# Patient Record
Sex: Female | Born: 1940 | Race: White | Hispanic: No | Smoking: Former smoker
Health system: Southern US, Community
[De-identification: ages and names within clinical notes are randomized; demographics above are authoritative.]

## PROBLEM LIST (undated history)

## (undated) DIAGNOSIS — E782 Mixed hyperlipidemia: Secondary | ICD-10-CM

## (undated) DIAGNOSIS — K219 Gastro-esophageal reflux disease without esophagitis: Secondary | ICD-10-CM

## (undated) DIAGNOSIS — I2510 Atherosclerotic heart disease of native coronary artery without angina pectoris: Secondary | ICD-10-CM

## (undated) DIAGNOSIS — F411 Generalized anxiety disorder: Secondary | ICD-10-CM

## (undated) DIAGNOSIS — J449 Chronic obstructive pulmonary disease, unspecified: Secondary | ICD-10-CM

## (undated) DIAGNOSIS — I10 Essential (primary) hypertension: Secondary | ICD-10-CM

## (undated) DIAGNOSIS — I6350 Cerebral infarction due to unspecified occlusion or stenosis of unspecified cerebral artery: Secondary | ICD-10-CM

## (undated) DIAGNOSIS — I6529 Occlusion and stenosis of unspecified carotid artery: Secondary | ICD-10-CM

## (undated) HISTORY — DX: Chronic obstructive pulmonary disease, unspecified: J44.9

## (undated) HISTORY — DX: Gastro-esophageal reflux disease without esophagitis: K21.9

## (undated) HISTORY — DX: Essential (primary) hypertension: I10

## (undated) HISTORY — DX: Cerebral infarction due to unspecified occlusion or stenosis of unspecified cerebral artery: I63.50

## (undated) HISTORY — DX: Atherosclerotic heart disease of native coronary artery without angina pectoris: I25.10

## (undated) HISTORY — DX: Mixed hyperlipidemia: E78.2

## (undated) HISTORY — PX: TOTAL KNEE ARTHROPLASTY: SHX125

## (undated) HISTORY — DX: Occlusion and stenosis of unspecified carotid artery: I65.29

## (undated) HISTORY — PX: BACK SURGERY: SHX140

## (undated) HISTORY — PX: CHOLECYSTECTOMY: SHX55

## (undated) HISTORY — DX: Generalized anxiety disorder: F41.1

## (undated) HISTORY — PX: ABDOMINAL HYSTERECTOMY: SHX81

---

## 1995-09-07 HISTORY — PX: CORONARY ARTERY BYPASS GRAFT: SHX141

## 1999-07-01 ENCOUNTER — Encounter: Payer: Self-pay | Admitting: Emergency Medicine

## 1999-07-01 ENCOUNTER — Inpatient Hospital Stay (HOSPITAL_COMMUNITY): Admission: EM | Admit: 1999-07-01 | Discharge: 1999-07-02 | Payer: Self-pay | Admitting: Emergency Medicine

## 1999-07-02 ENCOUNTER — Encounter: Payer: Self-pay | Admitting: *Deleted

## 2000-05-02 ENCOUNTER — Encounter: Payer: Self-pay | Admitting: Emergency Medicine

## 2000-05-02 ENCOUNTER — Inpatient Hospital Stay (HOSPITAL_COMMUNITY): Admission: EM | Admit: 2000-05-02 | Discharge: 2000-05-04 | Payer: Self-pay | Admitting: Emergency Medicine

## 2001-12-26 ENCOUNTER — Inpatient Hospital Stay (HOSPITAL_COMMUNITY): Admission: AD | Admit: 2001-12-26 | Discharge: 2001-12-28 | Payer: Self-pay | Admitting: Cardiology

## 2001-12-28 ENCOUNTER — Encounter: Payer: Self-pay | Admitting: Cardiology

## 2004-10-28 ENCOUNTER — Ambulatory Visit: Payer: Self-pay | Admitting: Cardiology

## 2004-11-10 ENCOUNTER — Ambulatory Visit: Payer: Self-pay

## 2006-02-01 ENCOUNTER — Ambulatory Visit: Payer: Self-pay | Admitting: Cardiology

## 2006-02-01 ENCOUNTER — Ambulatory Visit (HOSPITAL_COMMUNITY): Admission: RE | Admit: 2006-02-01 | Discharge: 2006-02-01 | Payer: Self-pay | Admitting: Cardiology

## 2006-02-04 ENCOUNTER — Ambulatory Visit: Payer: Self-pay | Admitting: Cardiology

## 2006-02-04 ENCOUNTER — Inpatient Hospital Stay (HOSPITAL_BASED_OUTPATIENT_CLINIC_OR_DEPARTMENT_OTHER): Admission: RE | Admit: 2006-02-04 | Discharge: 2006-02-04 | Payer: Self-pay | Admitting: Cardiology

## 2006-02-21 ENCOUNTER — Ambulatory Visit: Payer: Self-pay | Admitting: Cardiology

## 2006-02-22 ENCOUNTER — Ambulatory Visit: Payer: Self-pay

## 2006-08-10 ENCOUNTER — Ambulatory Visit: Payer: Self-pay | Admitting: Cardiology

## 2007-02-28 ENCOUNTER — Ambulatory Visit: Payer: Self-pay | Admitting: Cardiology

## 2007-02-28 ENCOUNTER — Ambulatory Visit: Payer: Self-pay

## 2007-03-13 ENCOUNTER — Inpatient Hospital Stay (HOSPITAL_COMMUNITY): Admission: AD | Admit: 2007-03-13 | Discharge: 2007-03-20 | Payer: Self-pay | Admitting: Psychiatry

## 2007-03-13 ENCOUNTER — Ambulatory Visit: Payer: Self-pay | Admitting: Psychiatry

## 2007-03-26 ENCOUNTER — Inpatient Hospital Stay (HOSPITAL_COMMUNITY): Admission: EM | Admit: 2007-03-26 | Discharge: 2007-03-29 | Payer: Self-pay | Admitting: Cardiology

## 2007-03-26 ENCOUNTER — Ambulatory Visit: Payer: Self-pay | Admitting: *Deleted

## 2007-03-26 ENCOUNTER — Ambulatory Visit: Payer: Self-pay | Admitting: Cardiovascular Disease

## 2007-03-27 ENCOUNTER — Encounter (INDEPENDENT_AMBULATORY_CARE_PROVIDER_SITE_OTHER): Payer: Self-pay | Admitting: *Deleted

## 2007-04-14 ENCOUNTER — Ambulatory Visit: Payer: Self-pay | Admitting: Cardiology

## 2007-12-04 ENCOUNTER — Ambulatory Visit: Payer: Self-pay | Admitting: Cardiology

## 2009-01-03 ENCOUNTER — Encounter: Payer: Self-pay | Admitting: Cardiology

## 2009-01-14 ENCOUNTER — Encounter: Payer: Self-pay | Admitting: Cardiology

## 2009-03-15 ENCOUNTER — Encounter: Admission: RE | Admit: 2009-03-15 | Discharge: 2009-03-15 | Payer: Self-pay | Admitting: Sports Medicine

## 2009-05-03 DIAGNOSIS — E782 Mixed hyperlipidemia: Secondary | ICD-10-CM

## 2009-05-03 DIAGNOSIS — I6350 Cerebral infarction due to unspecified occlusion or stenosis of unspecified cerebral artery: Secondary | ICD-10-CM | POA: Insufficient documentation

## 2009-05-03 DIAGNOSIS — I10 Essential (primary) hypertension: Secondary | ICD-10-CM | POA: Insufficient documentation

## 2009-05-03 DIAGNOSIS — F411 Generalized anxiety disorder: Secondary | ICD-10-CM | POA: Insufficient documentation

## 2009-05-03 DIAGNOSIS — I739 Peripheral vascular disease, unspecified: Secondary | ICD-10-CM

## 2009-05-03 DIAGNOSIS — K219 Gastro-esophageal reflux disease without esophagitis: Secondary | ICD-10-CM

## 2009-07-23 ENCOUNTER — Encounter: Payer: Self-pay | Admitting: Cardiology

## 2009-08-25 ENCOUNTER — Encounter: Payer: Self-pay | Admitting: Cardiology

## 2009-10-27 ENCOUNTER — Encounter: Payer: Self-pay | Admitting: Cardiology

## 2009-10-29 ENCOUNTER — Ambulatory Visit: Payer: Self-pay | Admitting: Cardiology

## 2009-10-29 DIAGNOSIS — R0989 Other specified symptoms and signs involving the circulatory and respiratory systems: Secondary | ICD-10-CM

## 2009-10-29 DIAGNOSIS — I447 Left bundle-branch block, unspecified: Secondary | ICD-10-CM

## 2009-10-29 DIAGNOSIS — I25810 Atherosclerosis of coronary artery bypass graft(s) without angina pectoris: Secondary | ICD-10-CM | POA: Insufficient documentation

## 2009-11-21 ENCOUNTER — Telehealth (INDEPENDENT_AMBULATORY_CARE_PROVIDER_SITE_OTHER): Payer: Self-pay | Admitting: *Deleted

## 2009-12-30 ENCOUNTER — Telehealth (INDEPENDENT_AMBULATORY_CARE_PROVIDER_SITE_OTHER): Payer: Self-pay | Admitting: *Deleted

## 2010-01-30 ENCOUNTER — Encounter: Payer: Self-pay | Admitting: Cardiology

## 2010-01-30 ENCOUNTER — Ambulatory Visit: Payer: Self-pay

## 2010-04-01 ENCOUNTER — Encounter: Payer: Self-pay | Admitting: Cardiology

## 2010-07-16 ENCOUNTER — Ambulatory Visit: Payer: Self-pay | Admitting: Internal Medicine

## 2010-07-16 ENCOUNTER — Inpatient Hospital Stay (HOSPITAL_COMMUNITY): Admission: EM | Admit: 2010-07-16 | Discharge: 2010-07-18 | Payer: Self-pay | Admitting: Cardiology

## 2010-07-24 ENCOUNTER — Encounter: Payer: Self-pay | Admitting: Cardiology

## 2010-08-04 ENCOUNTER — Encounter: Payer: Self-pay | Admitting: Cardiology

## 2010-08-05 ENCOUNTER — Ambulatory Visit: Payer: Self-pay | Admitting: Cardiology

## 2010-10-06 NOTE — Progress Notes (Signed)
  Phone Note Outgoing Call   Call placed by: Scherrie Bateman, LPN,  December 30, 2009 3:37 PM Call placed to: Patient Summary of Call: LEFT MESSAGE FOR PT TO CALL AND SCHEDULE CAROTID IS DUE  IN PREVIOUS MESSAGE SAID WOULD CALL AND SCHEDULE WHEN GOT OVER THE FLU HAVE NOT HEARD FROM PT. Initial call taken by: Scherrie Bateman, LPN,  December 30, 2009 3:38 PM  Follow-up for Phone Call        [email or web address] Scherrie Bateman, LPN  Jan 06, 453 5:33 PM [facility] Scherrie Bateman, LPN  Jan 16, 2010 12:54 PM CAROTID SCHEDULED FOR 01/28/10. Follow-up by: Scherrie Bateman, LPN,  Jan 19, 2010 1:23 PM

## 2010-10-06 NOTE — Assessment & Plan Note (Signed)
Summary: rov/kfw  Medications Added HYDROCHLOROTHIAZIDE 25 MG TABS (HYDROCHLOROTHIAZIDE) Take 1 tablet by mouth once a day CYMBALTA 30 MG CPEP (DULOXETINE HCL) 2 tabs once daily CRESTOR 20 MG TABS (ROSUVASTATIN CALCIUM) 1 tab at bedtime ALPRAZOLAM 0.5 MG TABS (ALPRAZOLAM) 1 tab four times daily as needed ATENOLOL 25 MG TABS (ATENOLOL) 1 tab once daily ASPIRIN 81 MG TBEC (ASPIRIN) Take one tablet by mouth daily VICODIN HP 10-660 MG TABS (HYDROCODONE-ACETAMINOPHEN) 1 tab once daily as needed FISH OIL 1000 MG CAPS (OMEGA-3 FATTY ACIDS) 1 cap once daily      Allergies Added: NKDA  Visit Type:  rov  CC:  sob and edema at times.Marland Kitchendenies any cp.  History of Present Illness: [name] comes in for evaluation management of her history of coronary artery disease, history of a left internal mammary graft to her LAD, normal LV systolic function, hyperlipidemia, hypertension, obesity, and nonobstructive carotid artery disease.  Unfortunately, she forgot to take her blood pressure medicine this morning. Her blood pressure with a large cuff is around 145/85 when I check it. She says it's good when she's checked by her primary care.  She is having no angina or ischemic symptoms. His chronic dyspnea on exertion secondary to her history of tobacco use and deconditioning. She denies palpitations, presyncope or syncope, orthopnea, PND peripheral edema.  Recent blood work from her primary care has been reviewed. Her lipids are at goal.  She denies any symptoms of TIAs or mini strokes.  Current Medications (verified): 1)  Amlodipine Besylate 5 Mg Tabs (Amlodipine Besylate) .Marland Kitchen.. 1 Tab Once Daily 2)  Hydrochlorothiazide 25 Mg Tabs (Hydrochlorothiazide) .... Take 1 Tablet By Mouth Once A Day 3)  Cymbalta 30 Mg Cpep (Duloxetine Hcl) .... 2 Tabs Once Daily 4)  Crestor 20 Mg Tabs (Rosuvastatin Calcium) .Marland Kitchen.. 1 Tab At Bedtime 5)  Alprazolam 0.5 Mg Tabs (Alprazolam) .Marland Kitchen.. 1 Tab Four Times Daily As Needed 6)   Atenolol 25 Mg Tabs (Atenolol) .Marland Kitchen.. 1 Tab Once Daily 7)  Aspirin 81 Mg Tbec (Aspirin) .... Take One Tablet By Mouth Daily 8)  Vicodin Hp 10-660 Mg Tabs (Hydrocodone-Acetaminophen) .Marland Kitchen.. 1 Tab Once Daily As Needed 9)  Fish Oil 1000 Mg Caps (Omega-3 Fatty Acids) .Marland Kitchen.. 1 Cap Once Daily  Allergies (verified): No Known Drug Allergies  Past History:  Past Medical History: Last updated: 05/03/2009 Current Problems:  HYPERTENSION (ICD-401.9) HYPERLIPIDEMIA, MIXED (ICD-272.2) CAROTID ARTERY DISEASE (ICD-433.10) CVA (ICD-434.91) ANXIETY (ICD-300.00) GERD (ICD-530.81)    Review of Systems       negative other than history of present illness  Vital Signs:  Patient profile:   70 year old female Height:      63 inches Weight:      218 pounds BMI:     38.76 Pulse rate:   60 / minute Pulse rhythm:   irregular BP sitting:   160 / 90  (left arm) Cuff size:   large  Vitals Entered By: Danielle Rankin, CMA (October 29, 2009 10:37 AM)  Physical Exam  General:  obese.  obese.   Head:  normocephalic and atraumatic Eyes:  PERRLA/EOM intact; conjunctiva and lids normal. Mouth:  poor dentition.  poor dentition.   Neck:  Neck supple, no JVD. No masses, thyromegaly or abnormal cervical nodes. Chest [name]:  no deformities or breast masses noted Lungs:  Clear bilaterally to auscultation and percussion. Heart:  poorly appreciated PMI, paradoxical S2, soft systolic murmur along left sternal border. Right carotid bruit Msk:  Back normal, normal gait. Muscle  strength and tone normal. Pulses:  pulses slightly reduced in the lower extremities but present, capillary refill adequate Extremities:  trace left pedal edema and trace right pedal edema.   Neurologic:  Alert and oriented x 3. Skin:  Intact without lesions or rashes. Psych:  Normal affect.   Problems:  Medical Problems Added: 1)  Dx of Lbbb  (ICD-426.3) 2)  Dx of Cad, Artery Bypass Graft  (ICD-414.04) 3)  Dx of Carotid Bruit, Right   (ICD-785.9)  Impression & Recommendations:  Problem # 1:  CAD, ARTERY BYPASS GRAFT (ICD-414.04) Assessment Unchanged  Her updated medication list for this problem includes:    Amlodipine Besylate 5 Mg Tabs (Amlodipine besylate) .Marland Kitchen... 1 tab once daily    Atenolol 25 Mg Tabs (Atenolol) .Marland Kitchen... 1 tab once daily    Aspirin 81 Mg Tbec (Aspirin) .Marland Kitchen... Take one tablet by mouth daily  Problem # 2:  CAROTID BRUIT, RIGHT (ICD-785.9) Assessment: Unchanged We will obtain carotid Dopplers. Orders: Carotid Duplex (Carotid Duplex)  Problem # 3:  HYPERTENSION (ICD-401.9) Assessment: Unchanged  Her updated medication list for this problem includes:    Amlodipine Besylate 5 Mg Tabs (Amlodipine besylate) .Marland Kitchen... 1 tab once daily    Hydrochlorothiazide 25 Mg Tabs (Hydrochlorothiazide) .Marland Kitchen... Take 1 tablet by mouth once a day    Atenolol 25 Mg Tabs (Atenolol) .Marland Kitchen... 1 tab once daily    Aspirin 81 Mg Tbec (Aspirin) .Marland Kitchen... Take one tablet by mouth daily  Orders: EKG w/ Interpretation (93000)  Problem # 4:  HYPERLIPIDEMIA, MIXED (ICD-272.2) Assessment: Improved  Her updated medication list for this problem includes:    Crestor 20 Mg Tabs (Rosuvastatin calcium) .Marland Kitchen... 1 tab at bedtime  Problem # 5:  LBBB (ICD-426.3) Assessment: Unchanged  Her updated medication list for this problem includes:    Amlodipine Besylate 5 Mg Tabs (Amlodipine besylate) .Marland Kitchen... 1 tab once daily    Atenolol 25 Mg Tabs (Atenolol) .Marland Kitchen... 1 tab once daily    Aspirin 81 Mg Tbec (Aspirin) .Marland Kitchen... Take one tablet by mouth daily  Patient Instructions: 1)  Your physician recommends that you schedule a follow-up appointment in: YEAR 2)  Your physician recommends that you continue on your current medications as directed. Please refer to the Current Medication list given to you today. 3)  Your physician has requested that you have a carotid duplex. This test is an ultrasound of the carotid arteries in your neck. It looks at blood flow through  these arteries that supply the brain with blood. Allow one hour for this exam. There are no restrictions or special instructions.  Appended Document: [facility]     Allergies: No Known Drug Allergies   EKG  Procedure date:  10/29/2009  Findings:      normal sinus rhythm, left bundle branch block, stable

## 2010-10-06 NOTE — Progress Notes (Signed)
  Phone Note Outgoing Call   Call placed by: Scherrie Bateman, LPN,  November 21, 2009 12:52 PM Call placed to: Patient Summary of Call: NEED TO RESCHEDULE CAROTID LEFT MESSAGE FOR PT TO CALL BACK Initial call taken by: Scherrie Bateman, LPN,  November 21, 2009 12:53 PM  Follow-up for Phone Call        [facility] Scherrie Bateman, LPN  November 25, 2009 8:49 AM Follow-up by: Scherrie Bateman, LPN,  November 25, 2009 8:49 AM  Additional Follow-up for Phone Call Additional follow up Details #1::        PER PT HAS FLU WILL CALL BACK AND RESCHEDULE CAROTID  WHEN FEELING BETTER. Additional Follow-up by: Scherrie Bateman, LPN,  November 25, 2009 9:31 AM

## 2010-10-06 NOTE — Assessment & Plan Note (Signed)
Summary: eph/ gd   Visit Type:  EPH Primary Krystal Teachey:  Foye Deer  CC:  denies any cardiac complaints today.  History of Present Illness: Ms Gardiner comes in today for followup after being discharged the hospital with acute renal failure felt to be secondary to hypotension from a new blood pressure meds and also dehydration.  We discontinue her Tribenzor. Since that time she felt better. Her biggest complaint today is having to pay $40 for her Benicar. She would like to go to a generic.  She complains of some intermittent dizziness which is unpredictable. She takes meclizine occasionally for vertigo. She denies any syncope. She's had no significant loss of hearing.  Current Medications (verified): 1)  Benicar 20 Mg Tabs (Olmesartan Medoxomil) .Marland Kitchen.. 1 Tab Once Daily 2)  Hydrochlorothiazide 25 Mg Tabs (Hydrochlorothiazide) .... Take 1 Tablet By Mouth Once A Day 3)  Cymbalta 30 Mg Cpep (Duloxetine Hcl) .... 2 Tabs Once Daily 4)  Crestor 20 Mg Tabs (Rosuvastatin Calcium) .Marland Kitchen.. 1 Tab At Bedtime 5)  Alprazolam 1 Mg Tabs (Alprazolam) .Marland Kitchen.. 1 Tab Four Times Daily As Needed 6)  Atenolol 25 Mg Tabs (Atenolol) .Marland Kitchen.. 1 Tab Once Daily 7)  Aspirin 81 Mg Tbec (Aspirin) .... Take One Tablet By Mouth Daily 8)  Vicodin Hp 10-660 Mg Tabs (Hydrocodone-Acetaminophen) .Marland Kitchen.. 1 Tab Once Daily As Needed 9)  Fish Oil 1000 Mg Caps (Omega-3 Fatty Acids) .Marland Kitchen.. 1 Cap Once Daily 10)  Wellbutrin Sr 150 Mg Xr12h-Tab (Bupropion Hcl) .Marland Kitchen.. 1 Tab Two Times A Day 11)  Prevacid 30 Mg Cpdr (Lansoprazole) .Marland Kitchen.. 1 Tab Once Daily 12)  Percocet 10-325 Mg Tabs (Oxycodone-Acetaminophen) .... As Needed 13)  Lidoderm 5 % Ptch (Lidocaine) .... As Needed  Allergies (verified): No Known Drug Allergies  Past History:  Past Medical History: Last updated: 05/03/2009 Current Problems:  HYPERTENSION (ICD-401.9) HYPERLIPIDEMIA, MIXED (ICD-272.2) CAROTID ARTERY DISEASE (ICD-433.10) CVA (ICD-434.91) ANXIETY (ICD-300.00) GERD  (ICD-530.81)    Past Surgical History: Last updated: 07/28/2010 CABG  Review of Systems       negative other than history of present illness  Vital Signs:  Patient profile:   70 year old female Height:      63 inches Weight:      209.50 pounds BMI:     37.25 Pulse rate:   64 / minute Pulse rhythm:   irregular BP sitting:   132 / 76  (left arm) Cuff size:   large  Vitals Entered By: Danielle Rankin, CMA (August 05, 2010 11:15 AM)  Physical Exam  General:  obese.   Head:  Normocephalic and atraumatic without obvious abnormalities. No apparent alopecia or balding. Eyes:  No corneal or conjunctival inflammation noted. EOMI. Perrla. Funduscopic exam benign, without hemorrhages, exudates or papilledema. Vision grossly normal. Neck:  No deformities, masses, or tenderness noted. Lungs:  Normal respiratory effort, chest expands symmetrically. Lungs are clear to auscultation, no crackles or wheezes. Heart:  PMI poorly appreciated, regular rate and rhythm, no gallop Msk:  No deformity or scoliosis noted of thoracic or lumbar spine.   Pulses:  R and L carotid,radial,femoral,dorsalis pedis and posterior tibial pulses are full and equal bilaterally Extremities:  trace left pedal edema and trace right pedal edema.   Neurologic:  No cranial nerve deficits noted. Station and gait are normal. Plantar reflexes are down-going bilaterally. DTRs are symmetrical throughout. Sensory, motor and coordinative functions appear intact. Skin:  Intact without suspicious lesions or rashes Psych:  Cognition and judgment appear intact. Alert and cooperative with normal  attention span and concentration. No apparent delusions, illusions, hallucinations   Impression & Recommendations:  Problem # 1:  HYPERTENSION (ICD-401.9) Assessment Improved I will change her to a losarten 50 mg a day once she runs out of her Benicar. She will monitor her blood pressure. Her updated medication list for this problem  includes:    Losartan Potassium 50 Mg Tabs (Losartan potassium) .Marland Kitchen... Take 1 tablet daily    Hydrochlorothiazide 25 Mg Tabs (Hydrochlorothiazide) .Marland Kitchen... Take 1 tablet by mouth once a day    Atenolol 25 Mg Tabs (Atenolol) .Marland Kitchen... 1 tab once daily    Aspirin 81 Mg Tbec (Aspirin) .Marland Kitchen... Take one tablet by mouth daily  Patient Instructions: 1)  Your physician recommends that you schedule a follow-up appointment in: 6 months with Dr. Daleen Squibb 2)  Your physician has recommended you make the following change in your medication: Start Losartan when you have finished your Benicar Prescriptions: LOSARTAN POTASSIUM 50 MG TABS (LOSARTAN POTASSIUM) Take 1 tablet daily  #30 x 11   Entered by:   Lisabeth Devoid RN   Authorized by:   Gaylord Shih, MD, [facility]   Signed by:   Lisabeth Devoid RN on 08/05/2010   Method used:   Print then Give to Patient   RxID:   [id number]

## 2010-11-17 LAB — CARDIAC PANEL(CRET KIN+CKTOT+MB+TROPI)
CK, MB: 1.2 ng/mL (ref 0.3–4.0)
Relative Index: INVALID (ref 0.0–2.5)
Total CK: 65 U/L (ref 7–177)

## 2010-11-17 LAB — BASIC METABOLIC PANEL
Calcium: 9.1 mg/dL (ref 8.4–10.5)
GFR calc Af Amer: 59 mL/min — ABNORMAL LOW (ref >60)
GFR calc non Af Amer: 49 mL/min — ABNORMAL LOW (ref >60)
Potassium: 4.4 mEq/L (ref 3.5–5.1)
Sodium: 136 mEq/L (ref 135–145)

## 2010-11-17 LAB — COMPREHENSIVE METABOLIC PANEL
AST: 18 U/L (ref 0–37)
Alkaline Phosphatase: 43 U/L (ref 39–117)
BUN: 25 mg/dL — ABNORMAL HIGH (ref 6–23)
CO2: 25 mEq/L (ref 19–32)
Chloride: 102 mEq/L (ref 96–112)
Creatinine, Ser: 1.65 mg/dL — ABNORMAL HIGH (ref 0.4–1.2)
GFR calc non Af Amer: 31 mL/min — ABNORMAL LOW (ref >60)
Total Bilirubin: 0.4 mg/dL (ref 0.3–1.2)

## 2010-11-17 LAB — CBC
Hemoglobin: 11.1 g/dL — ABNORMAL LOW (ref 12.0–15.0)
MCH: 33.6 pg (ref 26.0–34.0)
MCV: 97.6 fL (ref 78.0–100.0)
RBC: 3.3 MIL/uL — ABNORMAL LOW (ref 3.87–5.11)

## 2010-11-17 LAB — URINALYSIS, MICROSCOPIC ONLY
Bilirubin Urine: NEGATIVE
Glucose, UA: NEGATIVE mg/dL
Ketones, ur: NEGATIVE mg/dL
Protein, ur: NEGATIVE mg/dL

## 2010-11-17 LAB — LIPID PANEL
Cholesterol: 129 mg/dL (ref 0–200)
HDL: 50 mg/dL (ref >39)
Total CHOL/HDL Ratio: 2.6 RATIO
Triglycerides: 83 mg/dL (ref <150)

## 2010-11-17 LAB — URINE CULTURE
Colony Count: 7000
Culture  Setup Time: 201111110454

## 2010-11-17 LAB — PROTIME-INR: Prothrombin Time: 12.4 seconds (ref 11.6–15.2)

## 2010-11-17 LAB — CREATININE, URINE, RANDOM: Creatinine, Urine: 44.4 mg/dL

## 2010-11-17 LAB — HEMOGLOBIN A1C: Mean Plasma Glucose: 108 mg/dL (ref <117)

## 2011-01-19 NOTE — Assessment & Plan Note (Signed)
[facility]                            CARDIOLOGY OFFICE NOTE   RENIE, STELMACH                        MRN:          [id number]  DATE:12/04/2007                            DOB:          September 28, 1940    Ms. Lie returns today for followup.   PROBLEM LIST:  1. Coronary artery disease.  She is status post coronary bypass      grafting with left internal mammary graft to the left anterior      descending.  Her last catheterization was February 04, 2006, showing a      patent graft to the LAD.  She had minimal changes in the circumflex      dominant right.  EF was 65% with normal wall motion.  She is having      no angina or ischemic symptoms.  2. Hypertension.  3. Hyperlipidemia.  Her most recent blood work shows some trends      upward in her total cholesterol and LDL and a drop in her HDL.  She      has gained some weight and her blood sugar fasting was borderline      high at 105, per their lab.  4. Remote tobacco use.  5. History small right lacunar infarct.  6. Nonobstructive carotid disease.   She is having no symptoms of TIAs, any neurological complaints, no  angina, no palpitations, no presyncope, syncope, no orthopnea, PND or  peripheral edema.   She is anxious to do something about this sugar and we had a long talk  about that this day.  She clearly needs to cut back on the quantity that  she is eating.  By history, she sounds like she has a pretty good  understanding of what to eat.  She also says she is going to join the  Y and try to start walking gradually.   MEDICATIONS:  1. HCTZ 25 mg a day.  2. Norvasc 5 mg a day.  3. Enteric-coated aspirin 81 mg a day.  4. Atenolol 25 mg a day.  5. Xanax 0.5 four times a day.  6. Crestor 20 mg nightly.  7. Lexapro 20 mg a day.   PHYSICAL EXAMINATION:  GENERAL:  She is very pleasant.  VITAL SIGNS:  Blood pressure is 138/80, pulse 59 and regular.  EKG  confirms normal sinus rhythm with left  bundle which is old.  She has a  weight of 232 which is up 9 pounds.  Respirations 18 and unlabored.  Marland Kitchen  SKIN:  Her skin shows tanning bed effects.  HEENT:  Otherwise unremarkable.  Carotid upstrokes were equal  bilaterally without significant bruits.  Thyroid is not enlarged.  Trachea is midline.  LUNGS:  Clear.  HEART:  Nondisplaced PMI.  She has a paradoxically split S2, soft S1,  S2.  ABDOMEN:  Obese with good bowel sounds.  Organomegaly could not be  assessed.  EXTREMITIES:  No cyanosis, clubbing or edema.  Pulses are present.  NEUROLOGIC:  Intact.   ASSESSMENT:  I had a long talk with Danella Sensing today.  I  have strongly urged to  get involved with the [facility] and try to cut back on her calories.  Her  weight is clearly going up and has for the last few visits.  She now is  becoming prediabetic.  Her lipids have deteriorated as well.   I have made no changes in her medical program.  We will plan on seeing  her back again in July 2009.  She is due objective assessment of her  coronaries at that time, however, she does not want a stress test.  .     Maisie Fus C. Daleen Squibb, MD, [facility]  Electronically Signed    TCW/MedQ  DD: 12/04/2007  DT: 12/05/2007  Job #: [id number]   cc:   Barney Drain, M.D.

## 2011-01-19 NOTE — Assessment & Plan Note (Signed)
[facility]                            CARDIOLOGY OFFICE NOTE   [name], [name]                        MRN:          [id number]  DATE:02/28/2007                            DOB:          [birth year]    HISTORY OF PRESENT ILLNESS:  [name] returns today for further  management of the following issues:  1. Coronary artery disease. She is status post coronary artery bypass      grafting with left internal mammary graft to the LAD. Her      catheterization February 04, 2006 for atypical chest pain, showed a      patent mammary to the LAD. She has minimal luminal changes in the      circumflex, dominant right, that was normal. EF of 65% with normal      wall motion.  2. Hypertension, followed by Dr. Tomasa Blase. She has been under really      good control.  3. Hyperlipidemia. She is due blood work with him next week. She is      taking her Vytorin daily. She promises.  4. Remote tobacco use.  5. History of small right lacunar infarct.  6. Non-obstructive carotid disease. Preliminary Doppler's today showed      antegrade flow in both vertebral's, moderate non-obstructive plaque      in both internal carotid arteries, which are stable. Followup      suggested in 1 year.   She is having no angina or ischemic symptoms. She has been having some  symptoms of anxiety and depression and was recently started on Cymbalta  but it is making her sick. She stopped taking it. I advised her to  followup with the original prescriber.   MEDICATIONS:  Lisinopril 40 mg daily, Plavix 75 mg daily, Alprazolam 0.5  mg b.i.d., Fluoxetine 40 mg daily, Cozaar 100 mg daily, Atenolol 25 mg  daily, Fish oil 2400 mg daily, Wellbutrin 300 mg daily, Vytorin 10/80  daily, HCTZ 25 mg daily.   PHYSICAL EXAMINATION:  VITAL SIGNS:  Blood pressure 134/91, pulse 76 and  regular. Weight is 215, down 6.  HEENT:  Normocephalic and atraumatic. PERRLA. Extraocular movements  intact. Sclerae  clear. Facial asymmetry is normal.  NECK:  Carotid upstrokes are equal bilaterally without bruits. There is  no JVD. Thyroid is not enlarged. Trachea is midline.  LUNGS:  Clear to auscultation and percussion.  HEART:  Regular rate and rhythm. Without murmur, rub, or gallop.  ABDOMEN:  Soft. Good bowel sounds.  EXTREMITIES:  No edema. Pulses are intact.  NEUROLOGIC:  Examination is intact.  SKIN:  Unremarkable.   I had a long talk with [name] today. We reviewed the symptoms of  TIA's and how to respond by 911 if she were having a stroke. I have  asked her to continue with her current  medications. She will followup concerning the Cymbalta with her primary  care physician. She will have blood work drawn next week. We will plan  on seeing her back in a year.     Thomas C. Daleen Squibb, MD, [facility]  Electronically  Signed    TCW/MedQ  DD: 02/28/2007  DT: 02/28/2007  Job #: [id number]   cc:   Dr. Barney Drain

## 2011-01-19 NOTE — Assessment & Plan Note (Signed)
[facility]                            CARDIOLOGY OFFICE NOTE   BREON, DISS                        MRN:          [id number]  DATE:04/14/2007                            DOB:          09/28/1940    Ms. Galvao returns today after being discharged from the hospital with  hyperkalemia and paroxysmal atrial fibrillation.   Her potassium was 9 on first check.  She had atrial fibrillation with a  slow ventricular rate.   She was transferred from [facility].  Her potassium returned to  normal after appropriate therapy.  She was seen in consultation by Dr.  Rosanne Ashing Deterding.  It was not clear from their perspective why she  developed hyperkalemia.  She was on both Cozaar and lisinopril, which  she had been on for years, by the way.  She was also on HCTZ 25 mg a  day.  Her baseline creatinine and renal function was overall normal.   They performed an ACTH study, which was borderline low.  Dr. Darrick Penna  recommended repeat of this test.  This is going to be done by Dr.  Tomasa Blase, per the patient.   She feels well.   Her enzymes during the hospitalization were negative.  Her 2D echo was  normal.  She had had a previous catheterization about a year ago with  patent grafts.  On discharge, her magnesium was 2.1, her potassium was  4.4, creatinine 0.66.   We changed her antihypertensive therapy to Norvasc 5 mg a day.  She  continues on Atenolol 25 mg a day.  She is obviously no longer on  lisinopril and Cozaar.  She is not taking her HCTZ.   EXAM TODAY:  Her blood pressure was 128/72, pulse 65 and regular, weight  223.  The rest of her exam is unchanged.  She has minimal edema.  Pulses  are intact.   EKG was repeated.  She is in normal sinus rhythm with a left bundle.  She does have a history of intermittent left bundle.   ASSESSMENT AND PLAN:  Ms. Ehresman potassium was also checked by Dr.  Tomasa Blase last week and was normal.   The cause of  her hyperkalemia is still not clear.  She clearly needs an  ACTH stimulation study repeated, which is going to be performed by Dr.  Tomasa Blase next week.  I have renewed her HCTZ  25 mg once a day for her edema and pressure.  I have made no other  changes.  We will plan on seeing her back in three months.     Thomas C. Daleen Squibb, MD, [facility]  Electronically Signed    TCW/MedQ  DD: 04/14/2007  DT: 04/14/2007  Job #: [id number]   cc:   Dr. Barney Drain

## 2011-01-19 NOTE — Consult Note (Signed)
[name], [name]                 ACCOUNT NO.:  [id number]   MEDICAL RECORD NO.:  [id number]          PATIENT TYPE:  INP   LOCATION:  2904                         FACILITY:  [facility]   PHYSICIAN:  Fayrene Fearing L. Deterding, M.D.DATE OF BIRTH:  24-Feb-1941   DATE OF CONSULTATION:  DATE OF DISCHARGE:                                 CONSULTATION   REASON FOR CONSULTATION:  Hyperkalemia.   HISTORY OF PRESENT ILLNESS:  This is a 70 year old female who has a past  medical history of CABG, history of atrial fibrillation, hypertension,  CVA who came to [facility] from [place]  where she presented with  weakness and tingling, especially in the periorbital area but also in  her arms and was found to have a potassium of 9.3.  The patient started  to get worse on Sunday afternoon.  She was treated acutely there then  transferred here with K of 7.3 after treatment.  She was treated again  with Kayexalate, calcium, glucose, insulin and today it is 3.8.   MEDICATIONS:  Her home medications had included Plavix 75 mg a day,  Cozaar 100 mg once a day, lisinopril 40 mg a day, atenolol 50 mg a day,  Vytorin 10/80, Vicodin 5/325 every 4-6 hours as needed, Lexapro 10 mg a  day, Risperdal 0.25 mg 2 b.i.d., Xanax 0.5 mg as needed.   REVIEW OF SYSTEMS:  She denied chills, sweats, near syncope, headaches,  visual difficulty.  She had had some chest pain initially after she  presented to [facility] ER that resolved.  She had some heat intolerance.  She has nocturia x2-3.  No dysuria.  Sleeps on 1 pillow.  She had the  weakness and numbness.  She had pain in both knees where she has had  operations and in her back where she has had an operation on her  cervical spine also.  She had some nausea and vomiting after taking some  Kayexalate.   SOCIAL HISTORY:  She lives in [place].  She lives alone.  She is  retired.  A 30-pack-year history of smoking, quit in 1994.   DIETARY HISTORY:  Her dietary history reveals her to  eat a whole  watermelon in the last week, also drank at least 3 quarts of tomato  juice in the last week.   FAMILY HISTORY:  Mother died of cancer.  Father died of an MI.  She has  1 brother who is healthy.  She has 2 children.   PHYSICAL EXAMINATION:  VITAL SIGNS:  Temperature 98.4, pulse 56,  respiratory rate 17, blood pressure 139/59, sat 100% on room air.  GENERAL:  She is in no acute distress, lying comfortably.  She is a good  historian.  HEENT:  Fundi are unremarkable.  Pharynx is unremarkable.  NECK:  Without masses or thyromegaly.  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  She has no  lymphadenopathy in the axillary or supraclavicular but has posterior  cervical adenopathy.  CARDIOVASCULAR:  Regular rate and rhythm.  A grade 2/6 holosystolic  murmur  heard best at the apex.  PMI is -10 fifth intercostal space.  Pulses 2+/4+.  No bruits noted.  LUNGS:  Reveal no rales, rhonchi or wheezes.  Decreased breath sounds.  Decreased resonance to percussion.  Decreased expansion.  SKIN:  Reveals her to be pale, has multiple scars especially over the  knees and the cervical spine and over the sternum.  GI:  Soft, positive bowel sounds.  No organomegaly, nontender.  GU:  Deferred.  RECTAL:  Deferred.  EXTREMITIES:  No rash or lesions.  She has trace edema.  Joints:  She  has hypertrophic changes in the knees.  NEUROLOGIC:  Alert and oriented.  Cranial nerves II through XII grossly  intact.  Motor is 5/5.  Deep tendon reflexes are 1+/4+ and symmetric.   LABORATORY DATA:  Hemoglobin 12.6, white count 13.7, platelets 377,000.  Sodium 132, potassium 3.8, chloride of 100, bicarbonate 26, creatinine  of 0.76, BUN of 9, glucose 62.  She had had some low glucoses last night  in getting insulin.  EF 60%, troponin 0.03 x2.   ASSESSMENT:  1. Hyperkalemia.  The high potassium load and empiric excretion      related to her ACE inhibitor and ARB and beta  blockers, all      limiting her excretion.  She has had low bicarbonate consistent      with impaired distal tubular hydrogen and potassium excretion.      Question if that is related to meds or intrinsic renal disease.      Need to rule out hypoadrenocorticism.  She needs a diuretic if she      is to be on an ACE inhibitor, i.e., Lasix if she is going to take      an ACE inhibitor or an ARB.  2. Coronary artery disease.  3. Hypertension.  4. Obesity.   PLAN:  1. Dietary education.  2. Current HCTH.  3. Restart her ACE inhibitor as an outpatient with Lasix and follow      up.           ______________________________  Llana Aliment. Deterding, M.D.     JLD/MEDQ  D:  03/27/2007  T:  03/28/2007  Job:  [id number]

## 2011-01-19 NOTE — H&P (Signed)
[name], [name]                ACCOUNT NO.:  [id number]   MEDICAL RECORD NO.:  [id number]          PATIENT TYPE:  INP   LOCATION:  2904                         FACILITY:  [facility]   PHYSICIAN:  Madolyn Frieze. Jens Som, MD, FACCDATE OF BIRTH:  June 06, 1941   DATE OF ADMISSION:  03/26/2007  DATE OF DISCHARGE:                              HISTORY & PHYSICAL   PRIMARY CARDIOLOGIST:  Maisie Fus C. Wall, MD, FACC   REASON FOR ADMISSION:  Hyperkalemia and atrial fibrillation.   HISTORY OF PRESENT ILLNESS:  The patient is a 70 year old white female  with past medical history notable for coronary artery disease, status  post one single vessel CABG who was transferred for further evaluation  and management of hyperkalemia and atrial fibrillation.  The patient  states that she has been feeling poorly for approximately the past  month.  More specifically she states that she has felt a lack of energy.  She was recently seen by Dr. Daleen Squibb at the end of June at which time she  was doing well.  This evening she felt particularly poorly and began  having chest discomfort and palpitations.  She presented to [facility] and was found to have atrial fibrillation with a slow  ventricular response.  In addition her serum chemistry revealed a  potassium of 9 with normal renal function.  On review of her current  medications, she is taking Lisinopril and Losartan however she has been  on this combination for quite some time.  I have spoken to her  extensively regarding over-the-counter supplements or supplemental  potassium which she denies.  She is currently chest pain free and denies  any shortness of breath, orthopnea, or palpitations.  On arrival, stat  BMP was drawn and revealed a potassium of 7.3.  An EKG was obtained  which revealed normal sinus rhythm with frequent PAC's and a left bundle  branch block.  I am unclear regarding the chronicity of the left bundle  branch block and we will obtain her old  records.  Otherwise she is  hemodynamically stable and without complaints.   PAST MEDICAL HISTORY:  1. Coronary artery disease status post LIMA to LAD in 1994.  Repeat      cardiac catheterization in 2007 revealed a long 70% stenos sin the      mid LAD with a patent LIMA.  Otherwise her coronaries were normal      with an ejection fraction of 75%.  2. Hypertension.  3. Hyperlipidemia.  4. Remote history of tobacco use.  5. History of CVA.  6. Nonobstructive carotid disease.  7. Anxiety.  8. GERD.   CURRENT MEDICATIONS:  1. Lisinopril 40 mg daily.  2. Losartan 100 mg daily.  3. Plavix 75 mg daily.  4. Alprazolam 0.5 mg b.i.d.  5. Fluoxetine 40 mg daily.  6. Atenolol 25 mg daily.  7. Fish Oil 2400 mg daily.  8. Wellbutrin 300 mg daily.  9. Vytorin 10/80 mg daily.  10.Hydrochlorothiazide 25 mg daily.   SOCIAL HISTORY:  The patient lives in [place] alone.  She is retired.  She denies  any tobacco, alcohol, or illicit substances.   FAMILY HISTORY:  Noncontributory.   REVIEW OF SYSTEMS:  As per HPI.  Otherwise a complete review of systems  is negative.   PHYSICAL EXAMINATION:  VITAL SIGNS:  Blood pressure 109/92, heart rate  98, O2 saturations are 100% on room air.  GENERAL:  She is alert and oriented x3 in no acute distress, and  pleasantly conversant.  HEENT:  Normocephalic and atraumatic, EOMI, PERRL, nares patent,  oropharynx clear without erythema or exudate.  NECK:  Supple, full range of motion, no JVD.  There is no palpable  thyromegaly.  Carotid upstrokes are equal and symmetric bilaterally with  no audible bruit.  Lymphadenopathy; none.  CHEST:  Clear to auscultation bilaterally.  HEART:  Normal S1 and S2 with no audible murmurs, rubs, or gallops.  ABDOMEN:  Soft, nontender, and nondistended with positive bowel sounds  and no hepatosplenomegaly.  EXTREMITIES:  No cyanosis, clubbing, or edema.  Peripheral pulses are 2+  and symmetric bilaterally.  There are no  other rashes or ulceration  noted.  NEUROLOGY:  Grossly nonfocal.   LABORATORY DATA:  CBC; white count 9.4, hemoglobin 12.9, hematocrit  37.5, platelet count 402.  Sodium 142, potassium 7.3, chloride 109, CO2  27, BUN 11, creatinine 0.3, glucose 86, INR 0.9, PTT 26, BNP 78, CK-MB  2.3, troponin I 0.03   IMPRESSION::  1. Hyperkalemia with preserved renal function  2. Atrial Fibrillation  3. CAD s/p single vessel CABG 1994  4. Hypertension  5. Hyperlipidemia   PLAN:  On arrival to the CCU, [name] was hemodynamically stable  and in normal sinus rhythm.  Her serum K+ remained elevated (7.3) on her  initial BMP.  We will re-administer kayexylate 30G, furosemide 40mg  IV,  regular insulin 10 uinits IV and D50.  She was treated with Calcium at  [facility] and her serum level is 9.4 by BMP.  The etiology of her  hyperkalemia is somewhat elusive although I suspect that it has been  slowly progressive over the last month.  She is currently taking an ace  inhibitor and ARB and would at least seem to represent the most likely  etiology given her normal renal function.  I am currently holding both  of these medications and she will likely benefit from restructuring her  antihypertensive regimen during this hospitalization.  She has know CAD  s/p single vessel CABG in 1994.  Her cath in 2007 revealed a patent LIMA  to LAD with no other obstructive lesions noted.  She is currently not experiencing symptoms concerning for angina or  heart failure.  Her initial cardiac biomarkers are negative x 1.  Continue ASA and plavix daily.  We will reinitiate the remainder of her  medication regimen as above.      Audery Amel, MD   Electronically Signed     ______________________________  Madolyn Frieze. Jens Som, MD, [facility]    SHG/MEDQ  D:  03/26/2007  T:  03/27/2007  Job:  [id number]

## 2011-01-19 NOTE — Discharge Summary (Signed)
[name], [name]                 ACCOUNT NO.:  [id number]   MEDICAL RECORD NO.:  [id number]          PATIENT TYPE:  INP   LOCATION:  4728                         FACILITY:  [facility]   PHYSICIAN:  Jesse Sans. Wall, MD, FACCDATE OF BIRTH:  [birth year]   DATE OF ADMISSION:  03/26/2007  DATE OF DISCHARGE:  03/29/2007                               DISCHARGE SUMMARY   PROCEDURES:  None.   PRIMARY FINAL DISCHARGE DIAGNOSIS:  Hyperkalemia.   SECONDARY DIAGNOSES:  1. Paroxysmal atrial fibrillation.  2. Status post aortocoronary bypass surgery in 1994 with left internal      mammary artery to left anterior descending artery.  3. Status post catheterization in 2007 with patent left internal      mammary artery, 70% stenosis prior to graft insertion and otherwise      normal coronaries with an ejection fraction of 75%.  4. Hypertension.  5. Hyperlipidemia.  6. Obesity.  7. Remote history of tobacco use.  8. Remote history of cerebrovascular accident.  9. History of nonobstructive carotid disease by ultrasound.  10.Anxiety.  11.Gastroesophageal reflux disease symptoms.  12.Family history of coronary artery disease.   TIME AT DISCHARGE:  41 minutes.   HOSPITAL COURSE:  [name] is a 70 year old female with a history of  coronary artery disease.  She had been feeling a lack of energy since  last seeing Dr. Daleen Squibb at the end of June.  On the day of admission she  felt poorly with chest discomfort and palpitations and at [facility] was in atrial fibrillation with a slow ventricular response and  a potassium of 9.  She had not been using any over-the-counter  vitamin/herbal supplements except fish oil and does not take  supplemental potassium.   At [facility] she was treated with 10 units of subcu insulin as  well as dextrose, Kayexalate and calcium gluconate.  She was given an  additional 10 units of insulin IV here as well.  Her blood sugars  actually dropped to a low of 26  and she became pale and sweaty and she  was given a total of 3 ampules of D50 and started on D5W at 125 mL/hr.  Her blood sugars stabilized and she was gradually weaned off this.  Her  potassium on March 27, 2007, was down to 3.8.  Her renal function had  been normal but there was concern for the cause of this so a renal  consult was called.   She was seen by Dr. Darrick Penna and as part of her evaluation an ACTH  stimulation test was performed.  Her baseline a.m. cortisol level was  low normal at 4.4.  Thirty-minute level was 16.7 with a 60-minute level  of 18.5 and the reference range for both being greater than 20.  Dr.  Darrick Penna evaluated the results and recommended a retest as an  outpatient.  She will have this through Dr. Tomasa Blase.   Her cardiac enzymes were negative for MI.  A 2-D echocardiogram was  performed, which showed an EF of 60% and left ventricular wall thickness  at the  upper limits of normal.  There was some abnormal septal wall  motion but overall left ventricular systolic function was normal.  The  aortic valve was mildly calcified and she had no critical valvular  abnormalities.  Dr. Daleen Squibb did not feel that there was any further need  for cardiac evaluation at this time.   She had some burning with urination, and a urinalysis showed too  numerous to count wbc's and many bacteria as well as large amount of  leukocytes and positive nitrites.  She is on a 5-day course of Cipro and  will complete this as an outpatient.   Because of the atrial fibrillation she was initially on Cardizem, but  this was discontinued.  She is continued on her home dose of beta  blocker and once her potassium level normalized, so did her heart rhythm  and heart rate.   By March 29, 2007, [name] was ambulating without chest pain or  shortness of breath.  Her sodium was 133 with a potassium level of 4.4,  BUN 9, creatinine 0.66, chloride 95, CO2 29, glucose 111, blood albumin  3.0, calcium  9.3, phosphorous 3.8.  She was evaluated by Dr. Darrick Penna  and by Dr. Daleen Squibb, who considered her stable for discharge with outpatient  follow-up arranged.  Of note, a lipid profile showed a total cholesterol  of 140, triglycerides 89, HDL 61, LDL 61.  TSH within normal limits at  2.055.  Magnesium also within normal limits at 2.1.   DISCHARGE INSTRUCTIONS:  1. Her activity level is to be increased gradually.  2. She is to follow up with Dr. Tomasa Blase as needed.  Dr. Tomasa Blase is to      evaluate [name] and decide the timing of a repeat on her ACTH      stimulation test.  3. She is to follow up with Dr. Daleen Squibb on August 8 at 11:30 and get a      BMET at that time.   DISCHARGE MEDICATIONS:  1. Lisinopril is stopped.  2. Cozaar is stopped.  3. Hydrochlorothiazide 25 mg is on hold.  4. Plavix 75 mg daily.  5. Atenolol 50 mg one-half tablet daily.  6. Vytorin 10/80 mg daily.  7. Vicodin p.r.n.  8. Lexapro 10 mg a day.  9. Risperdal 0.25 mg one a.m., two p.m.  10.Xanax 0.5 mg p.r.n.  11.Aspirin 81 mg a day.  12.Fish oil as prior to admission.  13.Amlodipine 5 mg daily  14.Cipro 250 mg b.i.d. through March 31, 2007.      Theodore Demark, PA-C      Jesse Sans. Daleen Squibb, MD, [facility]  Electronically Signed    RB/MEDQ  D:  03/29/2007  T:  03/30/2007  Job:  [id number]   cc:   Janace Litten, MD

## 2011-01-22 NOTE — Discharge Summary (Signed)
. [facility]  Patient:    MARGURETE, GUAMAN                        MRN: [id number] Adm. Date:  [id number] Disc. Date: 05/04/00 Attending:  Rollene Rotunda Dictator:   Tereso Newcomer, P.A.                           Discharge Summary  DISCHARGE DIAGNOSES:  1. Single vessel coronary artery disease.  2. Status post coronary artery bypass graft in 1994 with left internal     mammary artery to left anterior descending.  3. Hypertension.  4. Hyperlipidemia.  5. Gastroesophageal reflux disease.  6. Degenerative joint disease.  7. Status post right total knee arthroplasty.  8. Status post cervical and lumbar surgery.  9. Transient ischemic attack, status post cath in 1997. 10. Anxiety.  PROCEDURE:  Cardiac catheterization on May 04, 2000, by Dr. Charlies Constable with the following results; LAD 70% ostial, circumflex and RCA okay, LIMA to LAD okay, LV normal, EF 60%.  HISTORY OF PRESENT ILLNESS:  This 70 year old white obese female with the above noted history presented to the emergency room at [facility] on May 02, 2000, for the evaluation of chest pain.  On the day prior to admission she developed substernal chest pressure 7 out of 10 that radiated up into her jaw and was associated with nausea, diaphoresis, and shortness of breath.  She also noted a lot of belching that did not give relief. Her pain was worsened by normal activities of daily living and made better by complete rest.  She denied any recent palpitations, paroxysmal nocturnal dyspnea, or orthopnea.  She noted a history of GERD, but this did not seem to be similar.  She noted that this was similar to her prebypass pain in 1994.  She did note a history of esophageal dilatation in the past.  She has had some dysphagia with solids and liquids recently.  She noted having 5 out of 10 chest pain in the emergency room upon initial evaluation, but was comfortable.  PHYSICAL  EXAMINATION:  GENERAL:  Revealed a well-developed, well-nourished female in no acute distress. DD:  05/04/00 TD:  05/04/00 Job: [id number]

## 2011-01-22 NOTE — Discharge Summary (Signed)
[place]  Patient:    [name], [name]                        MRN: [id number] Adm. Date:  [id number] Attending:  Rollene Rotunda Dictator:   Tereso Newcomer, P.A. CC:         [facility]             Versie Starks, M.D. [phone]                           Discharge Summary  PHYSICAL EXAMINATION:  VITAL SIGNS:  Blood pressure 179/72.  NECK:  Without bruits.  HEART:  Regular rate and rhythm.  Normal S1 and S2.  No murmurs, rubs, clicks, or gallops.  LUNGS:  Clear to auscultation.  ABDOMEN:  Obese.  Mild right upper quadrant tenderness.  No symptoms suggestive of Murphys sign.  No bruits.  EXTREMITIES:  Without edema.  Calves soft bilaterally.  NEUROLOGICAL:  Intact.  Chest x-ray no acute disease.  EKG normal sinus rhythm, heart rate 71, nonspecific ST T wave changes.  LABORATORY DATA:  Sodium 138, potassium 3.8, chloride 104, CO2 27, BUN 7, creatinine 0.8, glucose 98, white count 6600, hemoglobin 12.5, hematocrit 36.1, platelet count 345,000.  INR 1, CK 50, MB 0.7, troponin I less than 0.03.  HOSPITAL COURSE:  The patient was admitted with symptoms suggestive of unstable angina.  The films were reviewed.  The film in 1994 showed a tight ostial LAD.  She then had CABG and subsequent films in 1997 showed a 70% proximal LAD, but ostial disease was not noted.  In October of 2000, repeat cath showed only 30% LAD disease.  RCA ostial spasm was clearly noted by that cath.  Also a fixed plaque was noted in her right renal artery.  Therefore, plans were made to repeat cardiac catheterization.  If no obstructive CAD, then consideration would be made for adding calcium blocker to her nitrates. If her symptoms persisted, then GI consultation would be obtained.  She remained stable on day #2 of admission.  Unfortunately, cath was postponed due to other emergencies in the catheterization lab.  On May 04, 2000, she went for coronary angiography  by Dr. Charlies Constable.  The results are noted above. She had no immediate complications.  On the afternoon of May 04, 2000, she was found to be in stable condition.  She had been up to the bathroom once. The nurse noted some slight oozing, but no problems were noted with her groin wound.  At the time of this dictation, she would continue with her postcath ambulation.  If she was able to get a ride this evening, as long as her groin remained stable without continuous oozing, then she would be discharged to home.  DISCHARGE MEDICATIONS:  New medications.  1. Norvasc 5 mg p.o. q.d.  2. Prevacid 30 mg p.o. b.i.d. and this was increased.  3. Zocor 80 mg q.h.s.  4. Accupril 40 mg b.i.d.  5. Premarin 0.625 mg q.d.  6. Coated aspirin 325 mg q.d.  7. Prozac 20 mg q.d.  8. Xanax as needed.  9. Imdur 60 mg q.d. 10. Nitroglycerin 0.4 mg sublingual p.r.n. chest pain.  ACTIVITY:  No driving, heavy lifting, exertion, or sex for three days.  DIET:  Low fat, low cholesterol, and low sodium diet.  WOUND CARE:  She is to watch her groin for any increased  swelling, bleeding, or bruising, and call our office with concerns.  The decision has been made that she should follow up with the P.A. or Dr. Daleen Squibb in [place] if any problems recur or if she has recurrence of pain.  As noted above, she is being treated at this time for suspected spasm.  If her symptoms recur, as noted earlier in her admission, then GI consultation will be made. DD:  05/04/00 TD:  05/04/00 Job: [id number]

## 2011-01-22 NOTE — Cardiovascular Report (Signed)
NAMESOUA, LENK NO.:  [id number]   MEDICAL RECORD NO.:  [id number]          PATIENT TYPE:  OIB   LOCATION:  1965                         FACILITY:  [facility]   PHYSICIAN:  Rollene Rotunda, M.D.   DATE OF BIRTH:  06-29-1941   DATE OF PROCEDURE:  02/04/2006  DATE OF DISCHARGE:  02/04/2006                              CARDIAC CATHETERIZATION   PRIMARY CARE PHYSICIAN:  Dr. Foye Deer in [place].   CARDIOLOGIST:  Jesse Sans. Wall, M.D.   PROCEDURE:  Left heart catheterization/coronary arteriography.   INDICATIONS:  A patient with chest pain and coronary disease, status post  CABG in 1994 with a LIMA to the LAD.   PROCEDURE NOTE:  Left heart catheterization was performed with the right  femoral artery.  The artery was cannulated using anterior wall puncture.  A  #4 French arterial sheath was inserted via the modified Seldinger technique.  A preformed Judkins and a pigtail catheter were utilized.  The patient  tolerated the procedure well and left the lab in stable condition.   RESULTS:  1.  Hemodynamics:  LV 170/78, AO 190/22 (note that these were not done      simultaneously, so this was not a pull-back, and no gradient was      identified).  2.  Coronaries:  Left main was normal.  The LAD had ostial 30-40% stenosis      that was long.  There was a mid long 70% stenosis.  The remainder of the      vessel was seen to fill predominantly with flow from the LIMA to the      LAD.  The circumflex in the AV groove was small.  There was a large      branching obtuse marginal which had diffuse luminal irregularities.  The      right coronary artery was the dominant vessel and normal.  There was      moderate-sized PDA which was normal.  3.  Grafts:  A LIMA to the LAD was widely patent.  4.  Left ventriculogram:  The left ventriculogram was patent in the area of      projection.  The EF was 65% with normal wall motion.   CONCLUSION:  Single vessel coronary artery  disease.  Patent bypass graft.  Well preserved ejection fraction.  At this point, the patient will continue  to be managed medically with secondary risk reduction.  No further  cardiovascular testing is suggested.           ______________________________  Rollene Rotunda, M.D.     JH/MEDQ  D:  02/04/2006  T:  02/04/2006  Job:  [id number]   cc:   Thomas C. Wall, M.D.  [address]  [address]  [place]  [place] [address]   Dr. Ky Barban, [place]

## 2011-01-22 NOTE — Cardiovascular Report (Signed)
[place]. [facility]  Patient:    [name], [name]                        MRN: [id number] Proc. Date: 05/04/00 Adm. Date:  [id number] Attending:  Rollene Rotunda CC:         Barney Drain, M.D.             Thomas C. Wall, M.D. [facility]             Cardiopulmonary Lab                        Cardiac Catheterization  INDICATIONS:  Ms. Blacketer is 70 years old and had bypass surgery for ostial LAD disease in 1997.  She was studied in October of last year and at that time her ostial lesion did not appear as tight and appeared to be only 30% and she had a patent LIMA to the LAD from her previous bypass.  She has done well until the last few days when she developed substernal chest tightness and was admitted to the hospital.  DESCRIPTION OF PROCEDURE:  The procedure was performed through the right femoral artery using arterial sheath and 6 French preformed coronary catheters.  A front wall arterial punch was performed and Omnipaque contrast was used.  A LIMA catheter was used for injection of the LIMA graft.  With some difficulty navigating around the bend in the subclavian, but did this with the help of a JR4 right catheter and a Woolly wire, after which we exchanged for an exchange wire and after which we exchanged for the LIMA catheter.  She tolerated the procedure well and left the laboratory in satisfactory condition.  RESULTS:  The left main coronary artery was free of significant disease.  The left anterior descending gave rise to a diagonal branch, a large septal perforator, second diagonal branch, and then there was competing flow distally with the LIMA graft.  There was 70% ostial stenosis in the LAD.  The circumflex gave rise to a large marginal branch and an AV branch.  The large marginal branch had three subbranches.  These vessels were free of significant disease.  The right coronary artery was a large vessel that gave rise to a large marginal branch,  posterior descending branch, and two posterolateral branches. These vessels were free of significant disease.  The LIMA graft to the LAD was patent and functioned normally.  There was no significant distal disease in the LAD.  The left ventriculogram performed in the RAO projection showed good wall motion with no areas of hypokinesis.  The estimated ejection fraction was 60%.  HEMODYNAMIC:  The aortic pressure was 165/73 with a mean of 106.  The left ventricular pressure was 165/17.  CONCLUSION:  Coronary artery disease, status post coronary artery bypass graft surgery in 1977, with 70% narrowing in the ostium of the LAD, no major dissection in the circumflex and right coronary artery and a patent LIMA to the LAD with normal LV function.  RECOMMENDATIONS:  There is no clear source of ischemia.  The ostial LAD lesion appears somewhat tighter than on the previous study.  Will plan reassurance. DD:  05/04/00 TD:  05/04/00 Job: [id number]

## 2011-01-22 NOTE — Assessment & Plan Note (Signed)
[facility]                            CARDIOLOGY OFFICE NOTE   [name], Gilmore                        MRN:          [id number]  DATE:08/10/2006                            DOB:          May 29, 1941    [name] returns today for further management of the following  issues.  1. Coronary artery disease.  Status post coronary artery bypass      grafting in the past, the left internal mammary graft to left      anterior ascending.  Cardiac catheterization February 04, 2006 because      of chest pain showed a patent mammary to the left anterior      ascending.  Circ was small with luminal irregularities.  Right was      dominant and normal.  Ejection fraction 65% with normal wall      motion.  2. Hypertension, being followed by Dr. Tomasa Blase.  She was elevated on      her last visit, but now is much better controlled with increasing      her hydrochlorothiazide to 25 mg per day.  3. Hyperlipidemia.  She was at goal except for a low HDL in the past,      on Vytorin.  She says she is only taking it p.r.n. because of      muscle aches in her legs.  She thinks the fish oil will be all she      needs.  Her triglycerides are actually normal.  She complains of a      lot of acid reflux and indigestion, burping and belching.  4. Remote tobacco use.  5. History of small right lacunar infarct.  6. 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   Medications are listed on our maintenance medication list.   Her blood pressure today is 108/54.  Pulse 58 and regular.  Weight is  221, down 6.  SKIN:  Warm and dry.  Ruddy complexion.  HEENT:  Normocephalic and atraumatic.  PERRLA.  Extraocular movements  intact.  Sclerae clear.  Facial symmetry is normal.  Carotid upstrokes are equal bilaterally with soft systolic bruits.  There is no JVD.  Thyroid is not enlarged.  Trachea is midline.  LUNGS:  Clear.  HEART:  Reveals a regular rate and rhythm.  ABDOMINAL EXAM:  Soft with good bowel sounds.  EXTREMITIES:  No cyanosis, clubbing or edema.  Pulses are brisk.  NEUROLOGIC:  Exam is intact.  MUSCULOSKELETAL:  Intact.   ASSESSMENT AND PLAN:  [name] is doing well.  I have encouraged her  to get back on her Vytorin 10/80 daily.  If she starts having muscle  aches, Crestor 40 could be substituted.  She can either call Dr. Tomasa Blase  or me for that.  In addition, I have asked her to stop her fish oil  because of all of the reflux symptoms.  We will plan on seeing her back  in 6 months.  She will be due for carotid Dopplers at that time.     Thomas C. Wall, MD, [facility]  Electronically Signed    TCW/MedQ  DD: 08/10/2006  DT: 08/11/2006  Job #: [id number]   cc:   Foye Deer, M.D.

## 2011-01-22 NOTE — Discharge Summary (Signed)
[place]. [facility]  Patient:    [name], [name] Visit Number: [id number] MRN: [id number]          Service Type: MED Location: [phone] Attending Physician:  Mirian Mo Dictated by:   Brita Romp, P.A. Admit Date:  12/26/2001 Discharge Date: 12/28/2001   CC:         Hazeline Junker, M.D., [place]   Discharge Summary  DISCHARGE DIAGNOSES: 1. Chest pain status post cardiac catheterization, status post adenosine    Cardiolite examination. 2. History of bypass surgery in 1994. 3. Hyperlipidemia. 4. History of right lacunar infarction in 1997. 5. Hypertension. 6. Anxiety. 7. Gastroesophageal reflux disease.  HOSPITAL COURSE:  [name] is a 70 year old female who presented to the [facility] Emergency Room complaining of several weeks of increasing dyspnea on exertion as well as anterior chest burning.  She noted this did not change with sublingual nitroglycerin.  Two days prior to admission, she was seen at [facility] and discharged home where CT of the chest showed no aortic dissection or other abnormality.  The patient was seen and admitted by Dr. Juanito Doom.  Given her history, he thought it prudent to admit the patient to the hospital, obtain serial cardiac enzymes, and plan for cardiac catheterization.  On April 23, the patient was taken to the catheterization lab by Dr. Loraine Leriche Pulsipher.  Catheterization revealed no clear source of ischemia. Dr. Gerri Spore did note that the ostium of the right coronary artery was difficult to visualize but appeared to be approximately 50% narrowed.  He recommended adenosine Cardiolite exam the following day to assess the significance of her lesions.  Left ventriculogram showed normal motion with an ejection fraction of 71% and no mitral regurgitation.  On April 24, the patient underwent adenosine Cardiolite stress test.  Nuclear imaging revealed no ischemia with ejection fraction  66%.  As a result, she was felt to be stable for discharge.  DISCHARGE MEDICATIONS: 1. Enteric-coated aspirin 325 mg q.d. 2. Accupril 40 mg q.d. 3. Atenolol 50 mg q.d. 4. Plavix 75 mg q.d. 5. Xanax 0.5 mg p.o. t.i.d. 6. Prevacid 30 mg q.d. 7. Lipitor 80 mg q.h.s. 8. Celebrex 200 mg b.i.d. 9. Zoloft 100 mg b.i.d.  LABORATORY DATA:  White count 6.6, hemoglobin 13.3, hematocrit 38.3, platelets 317.  Sodium 138, potassium 3.9, chloride 103, CO2 24, BUN 12, creatinine 0.8, glucose 104, calcium 10.0, total protein 7.5, albumin 7.0, AST 22, ALT 24, alkaline phosphatase 94, total bilirubin 0.5.  Serial cardiac enzymes were negative for MI.  D-dimer was 0.23.  ACTIVITY:  The patient is to avoid driving, heaving lifting for two days.  DIET:  Low fat, low cholesterol.  WOUND CARE:  She is to watch the catheterization site for any pain, bleeding, or swelling and call [place] office for any of these problems.  FOLLOWUP:  She is to contact Dr. Arlean Hopping for a followup appointment.  Dictated by:   Brita Romp, P.A. Attending Physician:  Mirian Mo DD:  12/28/01 TD:  12/29/01 Job: [id number]

## 2011-01-22 NOTE — Discharge Summary (Signed)
[name], [name] NO.:  [id number]   MEDICAL RECORD NO.:  [id number]          PATIENT TYPE:  IPS   LOCATION:  0506                          FACILITY:  [facility]   PHYSICIAN:  [name], M.D.      DATE OF BIRTH:  [birth year]   DATE OF ADMISSION:  03/13/2007  DATE OF DISCHARGE:  03/20/2007                               DISCHARGE SUMMARY   CHIEF COMPLAINT AND PRESENT ILLNESS:  This was the first admission to  [facility] for this 70 year old white female,  voluntarily admitted.  Apparently, the grandson left suddenly.  She  found him gone in the morning, took an overdose of alprazolam.  Stressed  by grandson leaving the home, feeling that the daughter that is in town  was not affectionate.  Feeling overwhelmed, took the overdose.   PAST PSYCHIATRIC HISTORY:  Has seen [name] in the past.  First time  inpatient.   ALCOHOL AND DRUG HISTORY:  Denies active use of any substances.   MEDICAL HISTORY:  1. Asthma.  2. Dyslipidemia.  3. Status post low back surgery.  4. High blood pressure.   MEDICATIONS:  1. Zocor.  2. Atenolol 50 one-half tab daily.  3. Xanax 0.5 as needed.  4. Combivent inhaler.  5. Zetia 10 mg per day.   PHYSICAL EXAMINATION:  Performed.  Failed to show any acute findings.   LABORATORY DATA:  Sodium 132, potassium 4.4, glucose 93 TSH 2.536.  Drug  screen positive for benzodiazepines.  SGOT 13, SGPT 20, sodium 138,  potassium 3.1, glucose 95.  White blood cells 10.5, hemoglobin 11.   MENTAL STATUS EXAM:  Upon admission, revealed a fully-alert, cooperative  female.  Mood depressed.  Affect depressed, tearful.  Thought processes  logical, coherent and relevant.  Endorsed feeling overwhelmed.  Suicidal  ruminations, passive in nature.  No homicidal ideas, no delusions, no  hallucinations.  Cognition well-preserved.   ADMISSION DIAGNOSES:  AXIS I:  Major depression, recurrent.  AXIS II:  No diagnosis.  AXIS III:  Asthma,  chronic obstructive pulmonary disease, chronic back  pains, lipidemia, status post exacerbation of chronic obstructive  pulmonary disease.  AXIS IV:  Moderate.  AXIS V:  Upon admission 35.  Highest global assessment of functioning in  the last year 70-75.   COURSE IN THE HOSPITAL:  She was admitted.  She was started in  individual and group psychotherapy.  We maintained on Celebrex 200 mg  per day.  Given amoxicillin, Vicodin as needed for pain.  Initially on  Prozac and Wellbutrin.  Zetia 10 mg per day and Combivent 2 puffs.  Prozac was discontinued, and so was the Wellbutrin.  She was given some  Xanax as needed and placed on Lexapro.  Endorsed that this has been  building up for a long time.  She raised her grandson since he was in  8th grade.  She came home and he had moved out.  He is 17, just  graduated from high school.  For the last month, she felt that she was  losing him, and he  reassured her that he was not going anywhere.  Had  feeling that no one has ever cared for her since her mother died.  Put  all her life in her daughter after her husband left.  Did see [name] in [place] 2 or 3 years prior to this admission.  Conflict with  the daughter.  She retired in 1999 after 32 years working.  July 8th,  felt very tearful, feeling very overwhelmed.  Upset that she had done  all these things for her family and they are not supportive of her.  Endorsed that the family says that she is bitter and negative, but she  evidenced a lot of negative self-perception.  Very upset with the  situation, feeling used, victimized.  Would like to get the medication  changes.  She was on Prozac, Wellbutrin, did not work; so she was  looking into the Lexapro.  She said she overdosed in order to get  attention, but now is not sure if she wanted to live or not.  There was  a family session that did not go too well.  She was angry that the  grandson moved out the way he did without telling her.   Upset because  she does get enough attention from the family.  Family upset because she  is always trying to find something wrong with them.  She continued to be  stuck on feeling unappreciated and used by the family; around her when  they need something from her.  She just wants the best for daughter.  In  the past she admits she has disagreed in terms of the daughter's choice  in terms of relationships.  She felt that the daughter did not have a  good self-esteem, given the fact that she goes for people that are not  worth it.  Very tearful.  Could not see herself as being able to move  on.  Feeling used, rejected, angry.  Affect__________ of the loneliness.  We pursued the Lexapro, coping skills, grief and loss, CBT.  Continued  to have a very difficult time, personalizing, catastrophizing.  Unable  to see past seeing herself as a victim, people taking advantage of her.  She was challenged time-after-time in individual and group sessions in  terms of the way she was perceiving and maybe distorting things.  By  July 11th, she endorsed she was starting to feel better.  She is having  some mood fluctuations, but dealing with the angry in a very appropriate  way.  Upset with the family but interacting more, able to open up.  Still  catastrophizing, dreading the loneliness.  We pursued Risperdal,  and she continued to ruminate and be stuck.  We refrained, used CBT we  addressed the distortions.  In the next 72 hours, she started to do  better.  More euthymic.  Able to come to terms with what has been on.  She was able to sleep better, and by July 14th, she was in full contact  with reality, sleeping through the night.  No agitation.  No suicidal  ideas.  Wanting to be discharged.  Her mood markedly improved.  Her  affect was brighter.  Understood that there were issues she needed to  continue to address, but the family was willing to go into counseling  with her.   DISCHARGE DIAGNOSES:   AXIS I:  Major depressive disorder.  AXIS II:  No diagnosis.  AXIS III:  Asthma, chronic obstructive pulmonary  disease, chronic back  pain, dyslipidemia.  AXIS IV:  Moderate.  AXIS V:  Upon discharge 50-55.   Discharged on:  1. Celebrex 200 mg per day.  2. Lipitor 80 mg per day.  3. Zetia 10 mg per day.  4. Xanax 0.5 four times a day as needed.  5. Hydrochlorothiazide 25 mg per day.  6. Combivent inhaler 2 puffs 4 times a day.  7. Atenolol 25 mg per day.  8. Lexapro 10 mg per day.  9. Risperdal 0.25 in the morning and 0.5 at night.  10.Lisinopril 40 mg per day.  11.Vicodin 5 every 6 hours as needed for pain.   FOLLOWUP:  [facility].      [name], M.D.  Electronically Signed     IL/MEDQ  D:  04/17/2007  T:  04/18/2007  Job:  [id number]

## 2011-01-22 NOTE — Cardiovascular Report (Signed)
[place]. [facility]  Patient:    [name], [name] Visit Number: [id number] MRN: [id number]          Service Type: MED Location: [phone] Attending Physician:  Mirian Mo Dictated by:   Daisey Must, M.D. [facility] Proc. Date: 12/27/01 Admit Date:  12/26/2001   CC:         Barney Drain, M.D.  Thomas C. Wall, M.D. [facility]  Cardiac Catheterization Laboratory   Cardiac Catheterization  PROCEDURES PERFORMED: Left heart catheterization with coronary angiography, bypass graft angiogram, and left ventriculography.  INDICATIONS: The patient is a 70 year old woman with a history of previous single-vessel bypass surgery with the left internal mammary artery to the distal LAD. She presented to the office yesterday with recurrent substernal chest pain and was admitted and referred for cardiac catheterization.  DESCRIPTION OF PROCEDURE: A 6 French sheath was placed in the right femoral artery.  Catheters utilized included a 6 [place] JL4, JR4, internal mammary catheter and angled pigtail catheter. There was a kink in the proximal subclavian artery, but we were able to successfully pass the catheter around this utilizing a Wholey guide wire. This did not appear to be hemodynamically significant. Contrast was Omnipaque. There were no complications.  RESULTS:  HEMODYNAMICS: Left ventricular pressure 166/15.  Aortic pressure 166/80. There was no aortic valve gradient.  LEFT VENTRICULOGRAM: Wall motion is normal. Ejection fraction estimated at greater than or equal to 65%. There is no mitral regurgitation.  CORONARY ARTERIOGRAPHY: (Right dominant).  Left main has a distal 20% stenosis.  Left anterior descending artery has a 50% stenosis at its ostium. In the distal portion of the mid LAD there is a diffuse 70% stenosis just prior to the insertion of the left internal mammary graft. The LAD gives rise to a small first diagonal and normal sized second  diagonal.  Left circumflex gives rise to a single, large branching obtuse marginal. The obtuse marginal has a diffuse 30% stenosis proximally.  The right coronary artery is a dominant vessel. There is a 40-50% stenosis in the ostium of the right coronary artery, followed by 30% in the proximal vessel.  The right coronary artery gives rise to a large branching right ventricular branch, a small posterior descending artery and a small posterolateral branch.  Left internal mammary artery to the distal LAD is patent throughout its course. This fills a distal LAD as well as retrograde into the mid LAD. Left subclavian angiography reveals a kink in the proximal subclavian but this does not appear to be hemodynamically significant.  IMPRESSIONS: 1. Normal left ventricular systolic function. 2. One-vessel coronary disease involving the left anterior descending artery.    In addition, there was moderate disease in the ostium of the right    coronary artery which is of borderline severity. 3. Patent left internal mammary artery to the distal left anterior descending.  In summary, the patient appears to be adequately revascularized. There is no obvious source of ischemia. However, potential areas of ischemia may be the midportion of the LAD proximal to the internal mammary graft insertion. In addition, cannot rule out more significant disease in the ostium of the right coronary artery as it was somewhat difficult to clearly visualize the ostium. However, in the images we obtained, this did not appear to be critically narrowed.  PLAN: An adenosine Cardiolite will be obtained to rule out or localize any potential ischemia. Further recommendations will be based on the findings of the Cardiolite. Dictated by:  Daisey Must, M.D. [facility] Attending Physician:  Mirian Mo DD:  12/27/01 TD:  12/27/01 Job: [id number]

## 2011-02-05 ENCOUNTER — Ambulatory Visit: Payer: Self-pay | Admitting: Cardiology

## 2011-05-03 ENCOUNTER — Other Ambulatory Visit: Payer: Self-pay | Admitting: Neurology

## 2011-05-03 DIAGNOSIS — R269 Unspecified abnormalities of gait and mobility: Secondary | ICD-10-CM

## 2011-05-03 DIAGNOSIS — R51 Headache: Secondary | ICD-10-CM

## 2011-05-03 DIAGNOSIS — F29 Unspecified psychosis not due to a substance or known physiological condition: Secondary | ICD-10-CM

## 2011-05-03 DIAGNOSIS — F341 Dysthymic disorder: Secondary | ICD-10-CM

## 2011-05-03 DIAGNOSIS — I10 Essential (primary) hypertension: Secondary | ICD-10-CM

## 2011-05-11 ENCOUNTER — Ambulatory Visit
Admission: RE | Admit: 2011-05-11 | Discharge: 2011-05-11 | Disposition: A | Discharge status: Home / Self Care | Payer: Medicare Other | Source: Ambulatory Visit | Attending: Neurology | Admitting: Neurology

## 2011-05-11 DIAGNOSIS — F341 Dysthymic disorder: Secondary | ICD-10-CM

## 2011-05-11 DIAGNOSIS — F29 Unspecified psychosis not due to a substance or known physiological condition: Secondary | ICD-10-CM

## 2011-05-11 DIAGNOSIS — R269 Unspecified abnormalities of gait and mobility: Secondary | ICD-10-CM

## 2011-05-11 DIAGNOSIS — R51 Headache: Secondary | ICD-10-CM

## 2011-05-11 DIAGNOSIS — I10 Essential (primary) hypertension: Secondary | ICD-10-CM

## 2011-06-21 LAB — LIPID PANEL
LDL Cholesterol: 61
VLDL: 18

## 2011-06-21 LAB — CARDIAC PANEL(CRET KIN+CKTOT+MB+TROPI)
CK, MB: 2.3
CK, MB: 8.8 — ABNORMAL HIGH
Relative Index: 4 — ABNORMAL HIGH
Relative Index: 4.5 — ABNORMAL HIGH
Relative Index: INVALID
Total CK: 143
Troponin I: 0.01
Troponin I: 0.03
Troponin I: 0.03

## 2011-06-21 LAB — BASIC METABOLIC PANEL
BUN: 10
BUN: 11
BUN: 9
CO2: 23
Chloride: 100
Chloride: 109
GFR calc Af Amer: >60
GFR calc Af Amer: >60
GFR calc non Af Amer: >60
GFR calc non Af Amer: >60
GFR calc non Af Amer: >60
Potassium: 3.8
Potassium: 7.3
Sodium: 132 — ABNORMAL LOW
Sodium: 142

## 2011-06-21 LAB — CBC
HCT: 36.8
MCHC: 34.3
Platelets: 377
Platelets: 402 — ABNORMAL HIGH
RBC: 3.55 — ABNORMAL LOW
RBC: 3.91
RDW: 13.1
RDW: 13.3
WBC: 8.5

## 2011-06-21 LAB — RENAL FUNCTION PANEL
Albumin: 3 — ABNORMAL LOW
CO2: 29
Chloride: 95 — ABNORMAL LOW
Chloride: 95 — ABNORMAL LOW
Creatinine, Ser: 0.66
GFR calc Af Amer: >60
GFR calc Af Amer: >60
GFR calc non Af Amer: >60
GFR calc non Af Amer: >60
Potassium: 4.4
Potassium: 4.9
Sodium: 130 — ABNORMAL LOW
Sodium: 133 — ABNORMAL LOW

## 2011-06-21 LAB — CORTISOL: Cortisol, Plasma: 4.6

## 2011-06-21 LAB — URINE MICROSCOPIC-ADD ON

## 2011-06-21 LAB — ACTH STIMULATION, 3 TIME POINTS: Cortisol, 30 Min: 16.7 (ref >20)

## 2011-06-21 LAB — URINALYSIS, ROUTINE W REFLEX MICROSCOPIC
Bilirubin Urine: NEGATIVE
Ketones, ur: NEGATIVE
Specific Gravity, Urine: 1.02
pH: 7

## 2011-06-21 LAB — PROTIME-INR
INR: 0.9
Prothrombin Time: 12

## 2011-06-21 LAB — TSH: TSH: 2.055

## 2011-06-21 LAB — MAGNESIUM: Magnesium: 2.1

## 2011-06-22 LAB — BASIC METABOLIC PANEL
CO2: 31
Chloride: 96
Creatinine, Ser: 0.95
GFR calc Af Amer: >60

## 2011-06-22 LAB — ELECTROLYTE PANEL: Sodium: 132 — ABNORMAL LOW

## 2011-07-19 ENCOUNTER — Other Ambulatory Visit: Payer: Self-pay

## 2011-07-19 MED ORDER — AMLODIPINE BESYLATE 5 MG PO TABS: 5.0000 mg | ORAL_TABLET | Freq: Every day | ORAL | Status: DC

## 2011-08-18 ENCOUNTER — Encounter: Payer: Self-pay | Admitting: *Deleted

## 2011-08-19 ENCOUNTER — Ambulatory Visit (INDEPENDENT_AMBULATORY_CARE_PROVIDER_SITE_OTHER): Payer: Medicare Other | Admitting: Cardiology

## 2011-08-19 ENCOUNTER — Encounter: Payer: Self-pay | Admitting: Cardiology

## 2011-08-19 VITALS — BP 122/78 | HR 78 | Ht 63.0 in | Wt 193.0 lb

## 2011-08-19 DIAGNOSIS — I6350 Cerebral infarction due to unspecified occlusion or stenosis of unspecified cerebral artery: Secondary | ICD-10-CM

## 2011-08-19 DIAGNOSIS — I10 Essential (primary) hypertension: Secondary | ICD-10-CM

## 2011-08-19 DIAGNOSIS — I6529 Occlusion and stenosis of unspecified carotid artery: Secondary | ICD-10-CM

## 2011-08-19 DIAGNOSIS — I25810 Atherosclerosis of coronary artery bypass graft(s) without angina pectoris: Secondary | ICD-10-CM

## 2011-08-19 DIAGNOSIS — E782 Mixed hyperlipidemia: Secondary | ICD-10-CM

## 2011-08-19 DIAGNOSIS — I447 Left bundle-branch block, unspecified: Secondary | ICD-10-CM

## 2011-08-19 NOTE — Assessment & Plan Note (Signed)
Stable   No change in meds

## 2011-08-19 NOTE — Progress Notes (Signed)
HPI [name] comes in for follow up and management of her CAD, Carotid disease, LBBB. She is doing well without any angina or sxs of TIAs. She has lost weight and is now under 200 lbs.  She is compliant with meds. She does not smoke.  Past Medical History  Diagnosis Date  . Unspecified essential hypertension   . Mixed hyperlipidemia   . Occlusion and stenosis of carotid artery without mention of cerebral infarction   . Unspecified cerebral artery occlusion with cerebral infarction   . Anxiety state, unspecified   . Esophageal reflux     Current Outpatient Prescriptions  Medication Sig Dispense Refill  . ALPRAZolam (XANAX) 1 MG tablet Take 1 mg by mouth 4 (four) times daily as needed.        Marland Kitchen amLODipine (NORVASC) 5 MG tablet Take 1 tablet (5 mg total) by mouth daily.  30 tablet  4  . aspirin 81 MG tablet Take 81 mg by mouth daily.        Marland Kitchen atenolol (TENORMIN) 25 MG tablet Take 25 mg by mouth daily.        Marland Kitchen buPROPion (WELLBUTRIN SR) 150 MG 12 hr tablet Take 150 mg by mouth 2 (two) times daily.        . cholecalciferol (VITAMIN D) 1000 UNITS tablet Take 1,000 Units by mouth daily.        Marland Kitchen donepezil (ARICEPT) 5 MG tablet Take 5 mg by mouth at bedtime as needed.        . fish oil-omega-3 fatty acids 1000 MG capsule Take 1 g by mouth daily.        . furosemide (LASIX) 40 MG tablet Take 40 mg by mouth 4 (four) times daily as needed.        . hydrochlorothiazide (HYDRODIURIL) 25 MG tablet Take 25 mg by mouth daily.        Marland Kitchen lidocaine (LIDODERM) 5 % Place 1 patch onto the skin as needed. Remove & Discard patch within 12 hours or as directed by MD       . meclizine (ANTIVERT) 25 MG tablet Take 25 mg by mouth 3 (three) times daily as needed.        Marland Kitchen oxyCODONE-acetaminophen (PERCOCET) 10-325 MG per tablet Take 1 tablet by mouth as needed.        Marland Kitchen perphenazine (TRILAFON) 4 MG tablet Take 4 mg by mouth 2 (two) times daily.        . ranitidine (ZANTAC) 150 MG capsule Take 150 mg by mouth every  evening.        . simvastatin (ZOCOR) 40 MG tablet Take 40 mg by mouth at bedtime.        . topiramate (TOPAMAX) 100 MG tablet Take 100 mg by mouth daily.        Marland Kitchen zolpidem (AMBIEN) 10 MG tablet Take 10 mg by mouth at bedtime as needed.          No Known Allergies  No family history on file.  History   Social History  . Marital Status: Divorced    Spouse Name: N/A    Number of Children: N/A  . Years of Education: N/A   Occupational History  . Not on file.   Social History Main Topics  . Smoking status: Never Smoker   . Smokeless tobacco: Not on file  . Alcohol Use: No  . Drug Use: No  . Sexually Active: Not on file   Other Topics Concern  . Not  on file   Social History Narrative  . No narrative on file    ROS ALL NEGATIVE EXCEPT THOSE NOTED IN HPI  PE  General Appearance: well developed, well nourished in no acute distress, obese HEENT: symmetrical face, PERRLA, good dentition  Neck: no JVD, thyromegaly, or adenopathy, trachea midline Chest: symmetric without deformity Cardiac: PMI non-displaced, RRR, normal S1, paradoxical S2no gallop or murmur Lung: clear to ausculation and percussion Vascular: all pulses full without bruits  Abdominal: nondistended, nontender, good bowel sounds, no HSM, no bruits Extremities: no cyanosis, clubbing or edema, no sign of DVT, no varicosities  Skin: normal color, no rashes Neuro: alert and oriented x 3, non-focal Pysch: normal affect  EKG NSR, LBBB BMET    Component Value Date/Time   NA 136 07/18/2010 0330   K 4.4 07/18/2010 0330   CL 103 07/18/2010 0330   CO2 25 07/18/2010 0330   GLUCOSE 101* 07/18/2010 0330   BUN 17 07/18/2010 0330   CREATININE 1.12 07/18/2010 0330   CALCIUM 9.1 07/18/2010 0330   GFRNONAA 49* 07/18/2010 0330   GFRAA  Value: 59        The eGFR has been calculated using the MDRD equation. This calculation has not been validated in all clinical situations. eGFR's persistently <60 mL/min signify possible  Chronic Kidney Disease.* 07/18/2010 0330    Lipid Panel     Component Value Date/Time   CHOL  Value: 129        ATP III CLASSIFICATION:  <200     mg/dL   Desirable  960-454  mg/dL   Borderline High  >=098    mg/dL   High        11/91/4782 0149   TRIG 83 07/17/2010 0149   HDL 50 07/17/2010 0149   CHOLHDL 2.6 07/17/2010 0149   VLDL 17 07/17/2010 0149   LDLCALC  Value: 62        Total Cholesterol/HDL:CHD Risk Coronary Heart Disease Risk Table                     Men   Women  1/2 Average Risk   3.4   3.3  Average Risk       5.0   4.4  2 X Average Risk   9.6   7.1  3 X Average Risk  23.4   11.0        Use the calculated Patient Ratio above and the CHD Risk Table to determine the patient's CHD Risk.        ATP III CLASSIFICATION (LDL):  <100     mg/dL   Optimal  956-213  mg/dL   Near or Above                    Optimal  130-159  mg/dL   Borderline  086-578  mg/dL   High  >469     mg/dL   Very High 62/95/2841 0149    CBC    Component Value Date/Time   WBC 7.0 07/17/2010 0143   RBC 3.30* 07/17/2010 0143   HGB 11.1* 07/17/2010 0143   HCT 32.2* 07/17/2010 0143   PLT 281 07/17/2010 0143   MCV 97.6 07/17/2010 0143   MCH 33.6 07/17/2010 0143   MCHC 34.5 07/17/2010 0143   RDW 13.0 07/17/2010 0143

## 2011-08-19 NOTE — Assessment & Plan Note (Signed)
Asx. Arrange dopplers.

## 2011-08-19 NOTE — Patient Instructions (Addendum)
Your physician recommends that you continue on your current medications as directed. Please refer to the Current Medication list given to you today.  Your physician has requested that you have a carotid duplex. This test is an ultrasound of the carotid arteries in your neck. It looks at blood flow through these arteries that supply the brain with blood. Allow one hour for this exam. There are no restrictions or special instructions. January 3,2013 at 11:00am  Your physician wants you to follow-up in: 1 year with Dr. Daleen Squibb. You will receive a reminder letter in the mail two months in advance. If you don't receive a letter, please call our office to schedule the follow-up appointment.

## 2011-08-26 NOTE — Progress Notes (Signed)
Addended by: Mylo Red F on: 08/26/2011 04:38 PM   Modules accepted: Orders

## 2011-09-09 ENCOUNTER — Encounter: Payer: Medicare Other | Admitting: *Deleted

## 2011-10-05 ENCOUNTER — Encounter (INDEPENDENT_AMBULATORY_CARE_PROVIDER_SITE_OTHER): Payer: Medicare Other | Admitting: *Deleted

## 2011-10-05 DIAGNOSIS — I6529 Occlusion and stenosis of unspecified carotid artery: Secondary | ICD-10-CM

## 2012-09-20 IMAGING — CR DG CHEST 2V
2 series · 2 of 2 positions shown · non-contrast
Comparison: 02/01/2006

CLINICAL DATA: Bradycardia, hypotension, weakness

CHEST - 2 VIEW

[w chest pa]
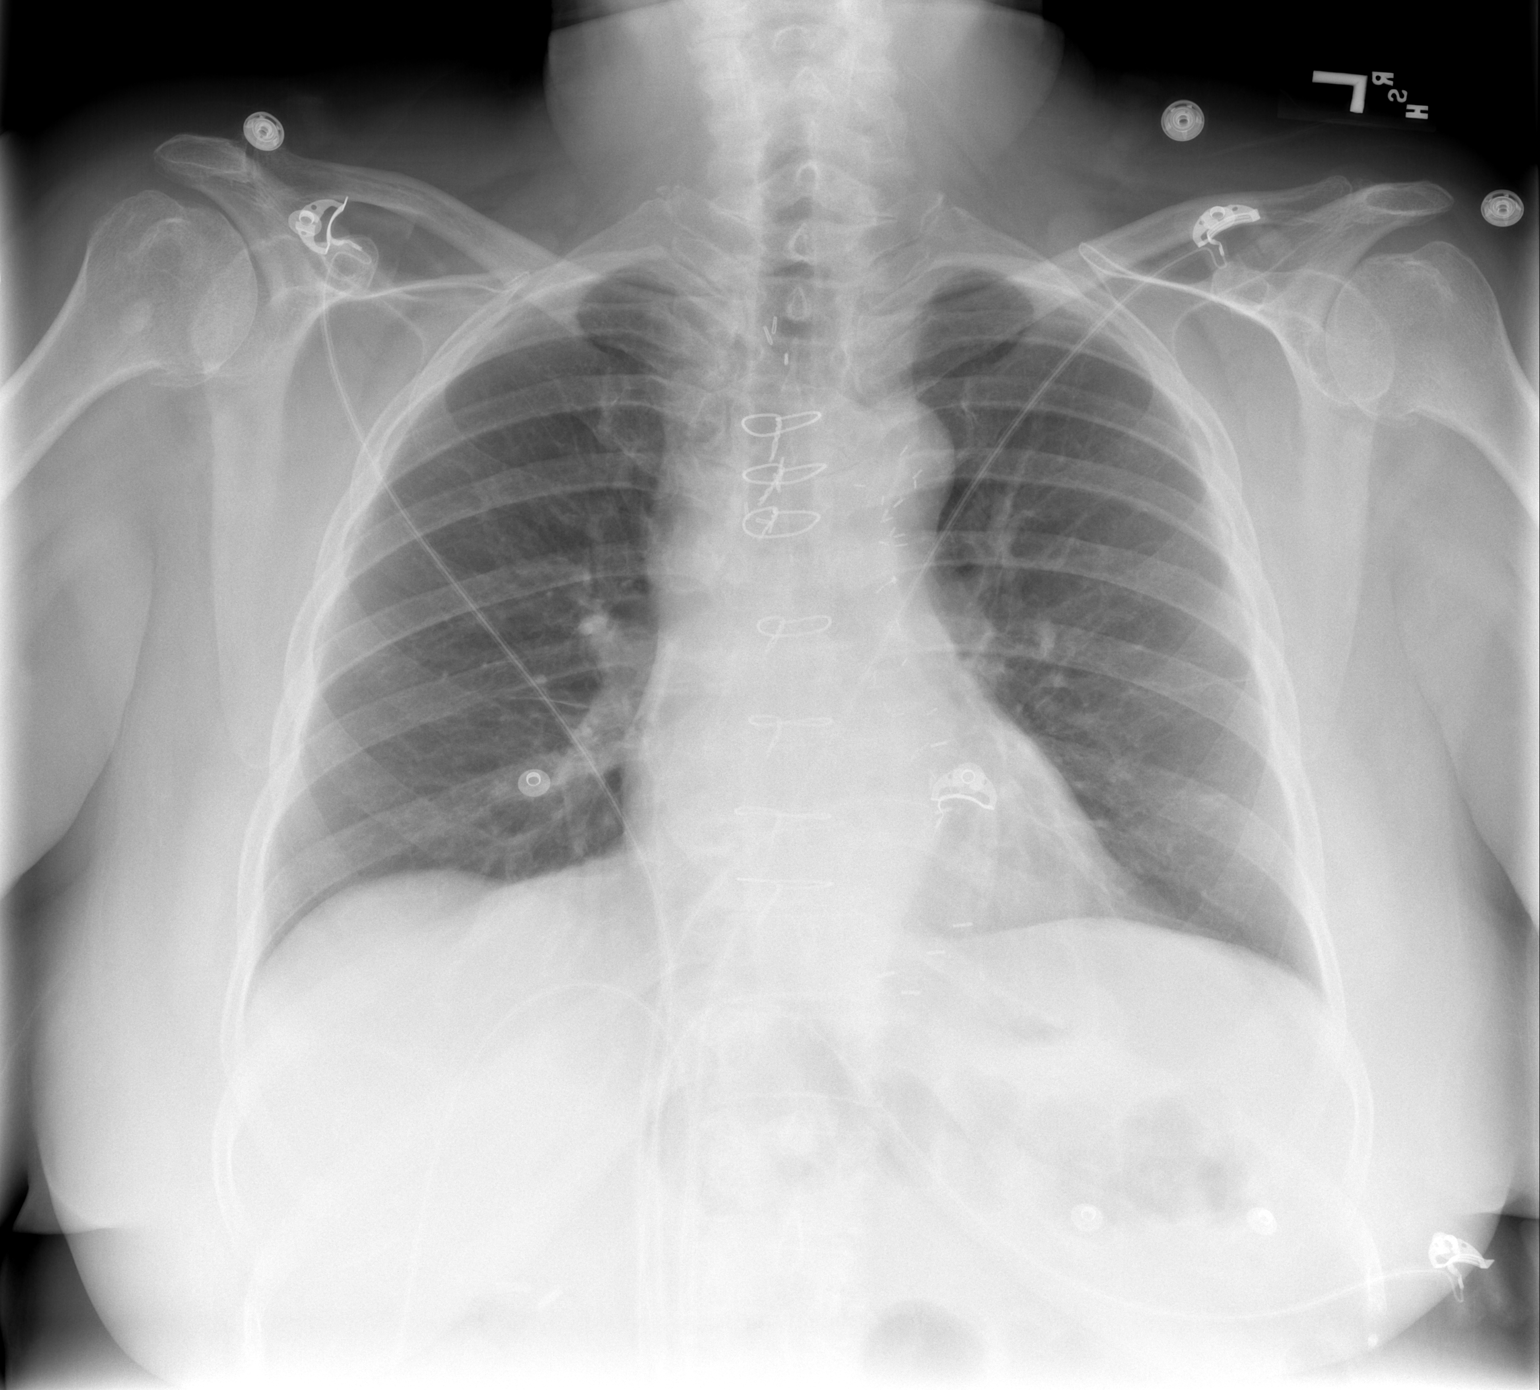

[w chest lat]
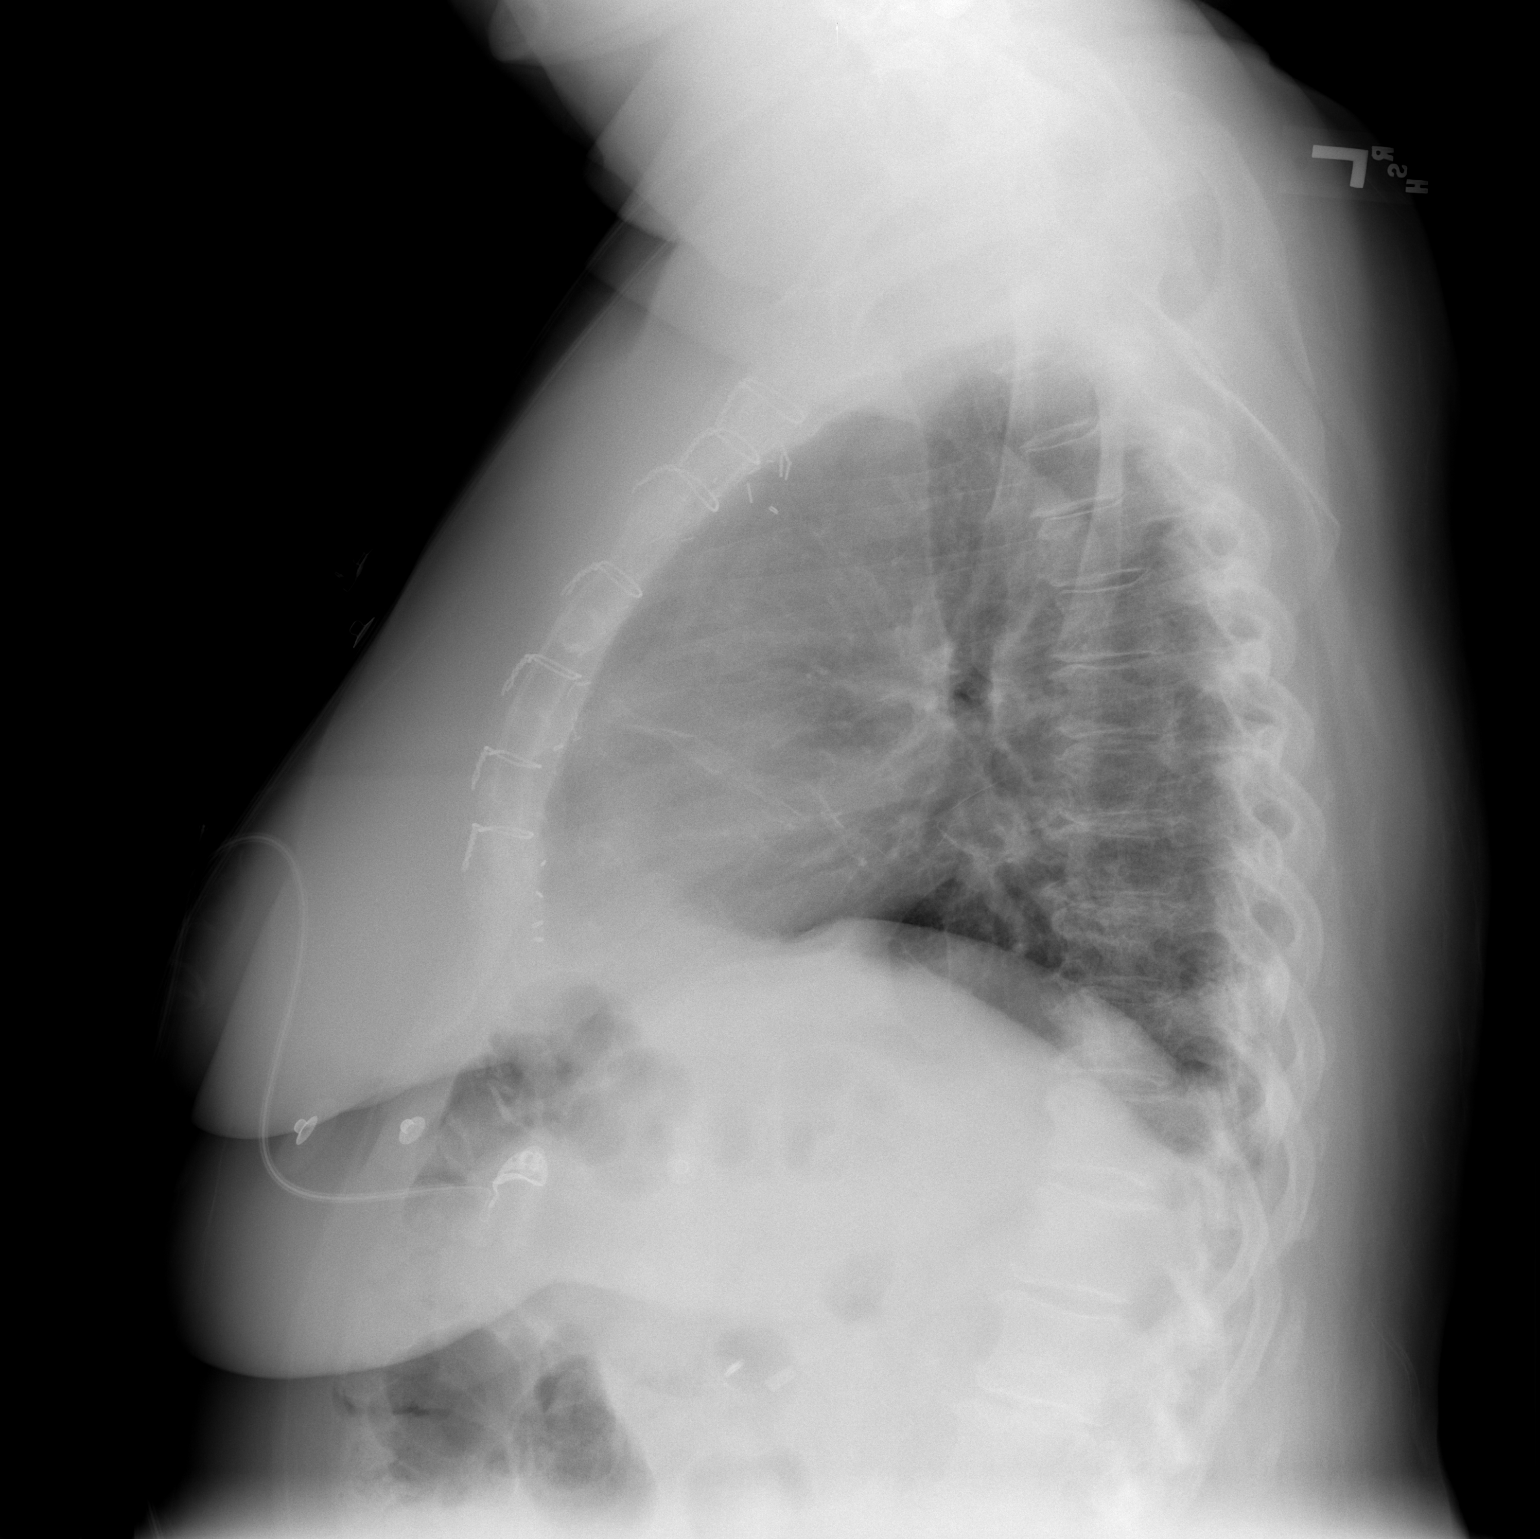

[2 of 2 positions shown; findings below may reference images not displayed]

FINDINGS: Evidence of CABG again noted.  Heart size normal.  The
lungs are clear.  Left mid lung zone linear scarring is stable.
Oval sclerotic focus in the right proximal humerus most likely
represents a bone island. Cholecystectomy clips noted.  No acute
osseous abnormality.
IMPRESSION: No focal acute finding.

## 2013-08-14 ENCOUNTER — Encounter: Payer: Self-pay | Admitting: Cardiovascular Disease

## 2013-08-14 ENCOUNTER — Ambulatory Visit (INDEPENDENT_AMBULATORY_CARE_PROVIDER_SITE_OTHER): Payer: Medicare Other | Admitting: Cardiovascular Disease

## 2013-08-14 VITALS — BP 141/92 | HR 63 | Ht 64.0 in | Wt 176.0 lb

## 2013-08-14 DIAGNOSIS — I779 Disorder of arteries and arterioles, unspecified: Secondary | ICD-10-CM

## 2013-08-14 DIAGNOSIS — I10 Essential (primary) hypertension: Secondary | ICD-10-CM

## 2013-08-14 DIAGNOSIS — E785 Hyperlipidemia, unspecified: Secondary | ICD-10-CM

## 2013-08-14 DIAGNOSIS — I25810 Atherosclerosis of coronary artery bypass graft(s) without angina pectoris: Secondary | ICD-10-CM

## 2013-08-14 DIAGNOSIS — I447 Left bundle-branch block, unspecified: Secondary | ICD-10-CM

## 2013-08-14 DIAGNOSIS — R011 Cardiac murmur, unspecified: Secondary | ICD-10-CM

## 2013-08-14 NOTE — Progress Notes (Signed)
History of Present Illness: 72 yo female with history CAD s/p CABG in 1997, HTN, HLD, carotid artery disease, COPD, LBBB, GERD here today for cardiac follow up. She has been followed by Dr. Daleen Squibb. She had single vessel CABG in 1997 (LIMA to LAD). Last cath 2007 with high grade LAD stenosis, patent LIMA to LAD, minimal disease in Circumflex and RCA. Carotid artery dopplers January 2013 with 60-79% stenosis. No recent echo.   She is here today for follow up. She has no complaints. No chest pain, SOB, palpitations, near syncope, syncope or LE edema.   Primary Care Physician: Dr. Judith Blonder [facility])  Last Lipid Profile: Followed in primary care.   Past Medical History  Diagnosis Date  . Unspecified essential hypertension   . Mixed hyperlipidemia   . Occlusion and stenosis of carotid artery without mention of cerebral infarction   . Unspecified cerebral artery occlusion with cerebral infarction   . Anxiety state, unspecified   . Esophageal reflux   . CAD (coronary artery disease)   . COPD (chronic obstructive pulmonary disease)     Past Surgical History  Procedure Laterality Date  . Coronary artery bypass graft  1997    LIMA to LAD  . Total knee arthroplasty      Bilateral  . Back surgery      x 2  . Cholecystectomy    . Abdominal hysterectomy      Current Outpatient Prescriptions  Medication Sig Dispense Refill  . ALPRAZolam (XANAX) 1 MG tablet Take 1 mg by mouth 4 (four) times daily as needed.        Marland Kitchen amLODipine (NORVASC) 5 MG tablet Take 1 tablet (5 mg total) by mouth daily.  30 tablet  4  . aspirin 81 MG tablet Take 81 mg by mouth daily.        Marland Kitchen atenolol (TENORMIN) 25 MG tablet Take 25 mg by mouth daily.        Marland Kitchen buPROPion (WELLBUTRIN SR) 150 MG 12 hr tablet Take 150 mg by mouth 2 (two) times daily.        . cholecalciferol (VITAMIN D) 1000 UNITS tablet Take 1,000 Units by mouth daily.        Marland Kitchen donepezil (ARICEPT) 5 MG tablet Take 5 mg by mouth at bedtime as needed.          . fish oil-omega-3 fatty acids 1000 MG capsule Take 1 g by mouth daily.        . furosemide (LASIX) 40 MG tablet Take 40 mg by mouth 4 (four) times daily as needed.        . hydrochlorothiazide (HYDRODIURIL) 25 MG tablet Take 25 mg by mouth daily.        Marland Kitchen lidocaine (LIDODERM) 5 % Place 1 patch onto the skin as needed. Remove & Discard patch within 12 hours or as directed by MD       . meclizine (ANTIVERT) 25 MG tablet Take 25 mg by mouth 3 (three) times daily as needed.        Marland Kitchen oxyCODONE-acetaminophen (PERCOCET) 10-325 MG per tablet Take 1 tablet by mouth as needed.        Marland Kitchen perphenazine (TRILAFON) 4 MG tablet Take 4 mg by mouth 2 (two) times daily.        . ranitidine (ZANTAC) 150 MG capsule Take 150 mg by mouth every evening.        . simvastatin (ZOCOR) 40 MG tablet Take 40 mg by mouth at  bedtime.        . topiramate (TOPAMAX) 100 MG tablet Take 100 mg by mouth daily.        Marland Kitchen zolpidem (AMBIEN) 10 MG tablet Take 10 mg by mouth at bedtime as needed.         No current facility-administered medications for this visit.    No Known Allergies  History   Social History  . Marital Status: Divorced    Spouse Name: N/A    Number of Children: 1  . Years of Education: N/A   Occupational History  . Retired-disability    Social History Main Topics  . Smoking status: Former Smoker -- 1.00 packs/day for 25 years    Types: Cigarettes    Quit date: 08/14/1993  . Smokeless tobacco: Not on file  . Alcohol Use: No  . Drug Use: No  . Sexual Activity: Not on file   Other Topics Concern  . Not on file   Social History Narrative  . No narrative on file    Family History  Problem Relation Age of Onset  . Heart attack Father 41  . Cancer - Other Mother     Review of Systems:  As stated in the HPI and otherwise negative.   BP 141/92  Pulse 63  Ht 5\' 4"  (1.626 m)  Wt 176 lb (79.833 kg)  BMI 30.20 kg/m2  Physical Examination: General: Well developed, well nourished, NAD HEENT:  OP clear, mucus membranes moist SKIN: warm, dry. No rashes. Neuro: No focal deficits Musculoskeletal: Muscle strength 5/5 all ext Psychiatric: Mood and affect normal Neck: No JVD, no carotid bruits, no thyromegaly, no lymphadenopathy. Lungs:Clear bilaterally, no wheezes, rhonci, crackles Cardiovascular: Regular rate and rhythm. No murmurs, gallops or rubs. Abdomen:Soft. Bowel sounds present. Non-tender.  Extremities: No lower extremity edema. Pulses are 2 + in the bilateral DP/PT.  EKG: Sinus, rate 63 bpm. 1st degree AV block. LBBB  Cardiac cath 2007:  1. Hemodynamics: LV 170/78, AO 190/22 (note that these were not done  simultaneously, so this was not a pull-back, and no gradient was  identified).  2. Coronaries: Left main was normal. The LAD had ostial 30-40% stenosis  that was long. There was a mid long 70% stenosis. The remainder of the  vessel was seen to fill predominantly with flow from the LIMA to the  LAD. The circumflex in the AV groove was small. There was a large  branching obtuse marginal which had diffuse luminal irregularities. The  right coronary artery was the dominant vessel and normal. There was  moderate-sized PDA which was normal.  3. Grafts: A LIMA to the LAD was widely patent.  4. Left ventriculogram: The left ventriculogram was patent in the area of  projection. The EF was 65% with normal wall motion.   Assessment and Plan:   1. CAD: Stable. Continue current meds. Will arrange echo to assess LVEF.   2. HTN: BP is borderline. Continue current meds.   3. Hyperlipidemia: Continue statin. Lipids followed in primary care.   4. LBBB: Chronic  5. Carotid artery disease: Repeat now. Last dopplers 1/13 with 60-79% bilateral stenosis.   6. Murmur: Echo as above.   Extensive record review today as I am meeting her for the first time.

## 2013-08-14 NOTE — Patient Instructions (Signed)
Your physician wants you to follow-up in:  6 months.   You will receive a reminder letter in the mail two months in advance. If you don't receive a letter, please call our office to schedule the follow-up appointment.  Your physician has requested that you have an echocardiogram. Echocardiography is a painless test that uses sound waves to create images of your heart. It provides your doctor with information about the size and shape of your heart and how well your heart's chambers and valves are working. This procedure takes approximately one hour. There are no restrictions for this procedure.   Your physician has requested that you have a carotid duplex. This test is an ultrasound of the carotid arteries in your neck. It looks at blood flow through these arteries that supply the brain with blood. Allow one hour for this exam. There are no restrictions or special instructions.   

## 2013-08-17 ENCOUNTER — Encounter (HOSPITAL_COMMUNITY): Payer: Medicare Other

## 2013-08-17 DIAGNOSIS — R0989 Other specified symptoms and signs involving the circulatory and respiratory systems: Secondary | ICD-10-CM

## 2013-08-27 ENCOUNTER — Encounter (HOSPITAL_COMMUNITY): Payer: Medicare Other

## 2013-08-28 ENCOUNTER — Ambulatory Visit (HOSPITAL_COMMUNITY): Payer: Medicare Other | Attending: Cardiovascular Disease | Admitting: Radiology

## 2013-08-28 ENCOUNTER — Other Ambulatory Visit: Payer: Self-pay

## 2013-08-28 ENCOUNTER — Encounter: Payer: Self-pay | Admitting: Cardiology

## 2013-08-28 ENCOUNTER — Ambulatory Visit (HOSPITAL_BASED_OUTPATIENT_CLINIC_OR_DEPARTMENT_OTHER): Payer: Medicare Other

## 2013-08-28 DIAGNOSIS — I25810 Atherosclerosis of coronary artery bypass graft(s) without angina pectoris: Secondary | ICD-10-CM | POA: Insufficient documentation

## 2013-08-28 DIAGNOSIS — I6529 Occlusion and stenosis of unspecified carotid artery: Secondary | ICD-10-CM

## 2013-08-28 DIAGNOSIS — I079 Rheumatic tricuspid valve disease, unspecified: Secondary | ICD-10-CM | POA: Insufficient documentation

## 2013-08-28 DIAGNOSIS — I379 Nonrheumatic pulmonary valve disorder, unspecified: Secondary | ICD-10-CM | POA: Insufficient documentation

## 2013-08-28 DIAGNOSIS — I059 Rheumatic mitral valve disease, unspecified: Secondary | ICD-10-CM | POA: Insufficient documentation

## 2013-08-28 DIAGNOSIS — I779 Disorder of arteries and arterioles, unspecified: Secondary | ICD-10-CM | POA: Insufficient documentation

## 2013-08-28 DIAGNOSIS — R011 Cardiac murmur, unspecified: Secondary | ICD-10-CM

## 2013-08-28 NOTE — Progress Notes (Signed)
Echocardiogram performed.  

## 2013-08-31 ENCOUNTER — Telehealth: Payer: Self-pay | Admitting: Cardiovascular Disease

## 2013-08-31 NOTE — Telephone Encounter (Signed)
follow up    Pt returning our call for her test results.  Please call her back.

## 2013-08-31 NOTE — Telephone Encounter (Signed)
I spoke with pt & she is aware of carotid doppler & echo results Mylo Red RN

## 2014-03-05 ENCOUNTER — Other Ambulatory Visit (HOSPITAL_COMMUNITY): Payer: Self-pay | Admitting: *Deleted

## 2014-03-05 DIAGNOSIS — I6529 Occlusion and stenosis of unspecified carotid artery: Secondary | ICD-10-CM

## 2014-04-24 ENCOUNTER — Encounter (HOSPITAL_COMMUNITY): Payer: Medicare Other

## 2016-10-08 ENCOUNTER — Encounter (INDEPENDENT_AMBULATORY_CARE_PROVIDER_SITE_OTHER): Payer: Self-pay

## 2016-10-08 ENCOUNTER — Ambulatory Visit (INDEPENDENT_AMBULATORY_CARE_PROVIDER_SITE_OTHER): Payer: Medicare Other | Admitting: Cardiology

## 2016-10-08 ENCOUNTER — Encounter: Payer: Self-pay | Admitting: Cardiology

## 2016-10-08 VITALS — BP 160/98 | HR 71 | Ht 61.0 in | Wt 147.8 lb

## 2016-10-08 DIAGNOSIS — I420 Dilated cardiomyopathy: Secondary | ICD-10-CM | POA: Diagnosis not present

## 2016-10-08 DIAGNOSIS — Z720 Tobacco use: Secondary | ICD-10-CM

## 2016-10-08 DIAGNOSIS — I5022 Chronic systolic (congestive) heart failure: Secondary | ICD-10-CM | POA: Diagnosis not present

## 2016-10-08 DIAGNOSIS — I739 Peripheral vascular disease, unspecified: Secondary | ICD-10-CM

## 2016-10-08 DIAGNOSIS — I779 Disorder of arteries and arterioles, unspecified: Secondary | ICD-10-CM

## 2016-10-08 DIAGNOSIS — I10 Essential (primary) hypertension: Secondary | ICD-10-CM | POA: Diagnosis not present

## 2016-10-08 DIAGNOSIS — I2510 Atherosclerotic heart disease of native coronary artery without angina pectoris: Secondary | ICD-10-CM

## 2016-10-08 MED ORDER — LOSARTAN POTASSIUM 50 MG PO TABS: 50.0000 mg | ORAL_TABLET | Freq: Every day | ORAL | 6 refills | Status: DC

## 2016-10-08 MED ORDER — CARVEDILOL 6.25 MG PO TABS: 6.2500 mg | ORAL_TABLET | Freq: Two times a day (BID) | ORAL | 6 refills | Status: DC

## 2016-10-08 NOTE — Progress Notes (Signed)
Cardiology Office Note    Date:  10/09/2016   ID:  [name], DOB 1941-08-30, MRN [id number]  PCP:  Kelly Notice, RN  Cardiologist:   Donato Schultz, MD     History of Present Illness:  [name] is a 76 y.o. female with history CAD s/p CABG LIMA to LADin 1997, HTN, HLD, carotid artery disease, COPD, LBBB, GERD here today to establish care with me. She had been followed by Dr. Daleen Squibb in the past. She had single vessel CABG in 1997 (LIMA to LAD). Last cath 2007 with high grade LAD stenosis, patent LIMA to LAD, minimal disease in Circumflex and RCA. Carotid artery dopplers January 2013 with 60-79% stenosis. Recent echo showed decreased EF 40-45% from normal, mild MR (done 08/11/16).   She is here today for follow up. She has complaints of chronic leg pain, leg edema worse in the past 3 months. No chest pain, +SOB, continues to smoke, palpitations, no syncope,. + LE edema.   Here with [name] who lives with her.     Past Medical History:  Diagnosis Date  . Anxiety state, unspecified   . CAD (coronary artery disease)   . COPD (chronic obstructive pulmonary disease) (HCC)   . Esophageal reflux   . Mixed hyperlipidemia   . Occlusion and stenosis of carotid artery without mention of cerebral infarction   . Unspecified cerebral artery occlusion with cerebral infarction   . Unspecified essential hypertension     Past Surgical History:  Procedure Laterality Date  . ABDOMINAL HYSTERECTOMY    . BACK SURGERY     x 2  . CHOLECYSTECTOMY    . CORONARY ARTERY BYPASS GRAFT  1997   LIMA to LAD  . TOTAL KNEE ARTHROPLASTY     Bilateral    Current Medications: Outpatient Medications Prior to Visit  Medication Sig Dispense Refill  . ALPRAZolam (NIRAVAM) 0.5 MG dissolvable tablet Take 1 mg by mouth 4 (four) times daily as needed.      Marland Kitchen aspirin 81 MG tablet Take 81 mg by mouth daily.      Marland Kitchen buPROPion (WELLBUTRIN SR) 150 MG 12 hr tablet Take 150 mg by mouth 2 (two) times daily.       . cholecalciferol (VITAMIN D) 1000 UNITS tablet Take 1,000 Units by mouth daily.      Marland Kitchen donepezil (ARICEPT) 5 MG tablet Take 5 mg by mouth at bedtime as needed.      . fish oil-omega-3 fatty acids 1000 MG capsule Take 1 g by mouth daily.      . furosemide (LASIX) 40 MG tablet Take 40 mg by mouth 4 (four) times daily as needed.      . hydrochlorothiazide (HYDRODIURIL) 25 MG tablet Take 25 mg by mouth daily.      . meclizine (ANTIVERT) 25 MG tablet Take 25 mg by mouth 3 (three) times daily as needed.      Marland Kitchen oxyCODONE-acetaminophen (PERCOCET) 10-325 MG per tablet Take 1 tablet by mouth as needed.      Marland Kitchen perphenazine (TRILAFON) 4 MG tablet Take 4 mg by mouth 2 (two) times daily.      . ranitidine (ZANTAC) 150 MG capsule Take 150 mg by mouth every evening.      . simvastatin (ZOCOR) 40 MG tablet Take 40 mg by mouth at bedtime.      . topiramate (TOPAMAX) 100 MG tablet Take 100 mg by mouth daily.      Marland Kitchen atenolol (TENORMIN) 25 MG tablet Take  25 mg by mouth daily.      Marland Kitchen amLODipine (NORVASC) 5 MG tablet Take 1 tablet (5 mg total) by mouth daily. 30 tablet 4  . lidocaine (LIDODERM) 5 % Place 1 patch onto the skin as needed. Remove & Discard patch within 12 hours or as directed by MD     . zolpidem (AMBIEN) 10 MG tablet Take 10 mg by mouth at bedtime as needed.       No facility-administered medications prior to visit.      Allergies:   Patient has no known allergies.   Social History   Social History  . Marital status: Divorced    Spouse name: N/A  . Number of children: 1  . Years of education: N/A   Occupational History  . Retired-disability    Social History Main Topics  . Smoking status: Former Smoker    Packs/day: 1.00    Years: 25.00    Types: Cigarettes    Quit date: 08/14/1993  . Smokeless tobacco: Never Used  . Alcohol use No  . Drug use: No  . Sexual activity: Not on file   Other Topics Concern  . Not on file   Social History Narrative  . No narrative on file      Family History:  The patient's family history includes Cancer - Other in her mother; Heart attack (age of onset: 53) in her father.   ROS:   Please see the history of present illness.    ROS All other systems reviewed and are negative.   PHYSICAL EXAM:   VS:  BP (!) 160/98   Pulse 71   Ht 5\' 1"  (1.549 m)   Wt 147 lb 12.8 oz (67 kg)   LMP  (LMP Unknown)   BMI 27.93 kg/m    GEN: Well nourished, well developed, in no acute distress  HEENT: normal  Neck: no JVD, carotid bruits, or masses Cardiac: RRR; 1/6 SM apex, no rubs, or gallops, mild LE bilateral edema  Respiratory:  clear to auscultation bilaterally, normal work of breathing GI: soft, nontender, nondistended, + BS MS: no deformity or atrophy  Skin: warm and dry, no rash Neuro:  Alert and Oriented x 3, Strength and sensation are intact Psych: euthymic mood, full affect  Wt Readings from Last 3 Encounters:  10/08/16 147 lb 12.8 oz (67 kg)  08/14/13 176 lb (79.8 kg)  08/19/11 193 lb (87.5 kg)      Studies/Labs Reviewed:   EKG:  EKG is ordered today.  The ekg ordered today demonstrates 10/08/16 - NSR, LBBB, NSSTW changes, personally viewed. No significant change from prior (1st degree AVB no longer seen)  Recent Labs: No results found for requested labs within last 8760 hours.   Lipid Panel    Component Value Date/Time   CHOL  07/17/2010 0149    129        ATP III CLASSIFICATION:  <200     mg/dL   Desirable  161-096  mg/dL   Borderline High  >=045    mg/dL   High          TRIG 83 07/17/2010 0149   HDL 50 07/17/2010 0149   CHOLHDL 2.6 07/17/2010 0149   VLDL 17 07/17/2010 0149   LDLCALC  07/17/2010 0149    62        Total Cholesterol/HDL:CHD Risk Coronary Heart Disease Risk Table  Men   Women  1/2 Average Risk   3.4   3.3  Average Risk       5.0   4.4  2 X Average Risk   9.6   7.1  3 X Average Risk  23.4   11.0        Use the calculated Patient Ratio above and the CHD Risk Table to  determine the patient's CHD Risk.        ATP III CLASSIFICATION (LDL):  <100     mg/dL   Optimal  161-096100-129  mg/dL   Near or Above                    Optimal  130-159  mg/dL   Borderline  045-409160-189  mg/dL   High  >811>190     mg/dL   Very High    Additional studies/ records that were reviewed today include:  Prior office notes, labs, ecg, echo reviewed.     ASSESSMENT:    1. Chronic systolic CHF (congestive heart failure) (HCC)   2. Essential hypertension   3. Bilateral carotid artery disease (HCC)   4. Dilated cardiomyopathy (HCC)   5. Coronary artery disease involving native coronary artery of native heart without angina pectoris   6. Tobacco abuse      PLAN:  In order of problems listed above:  1. CAD/ Ischemic cardiomyopathy/chronic systolic heart failure:  Echo EF has decreased. I encouraged use of lasix, which she states she uses sparingly because of need to urinate.   - Salt and fluid restriction  - Lasix 40 BID for the next 3 days then 40 QD thereafter  - Stopping amlodipine (helps with edema as well) and changing atenolol to coreg 6.25 BID for HF use.   - Restarting losartan 50 QD (was on refferring MD's med list, ? If taking)  - Check BMET at next visit  2. HTN: BP is borderline. Med changes above.   3. Hyperlipidemia: Continue statin. Lipids followed in primary care.   4. LBBB: Chronic. No indications for pacemaker  5. Carotid artery disease: Repeat next clinic visit. Last dopplers 2014 with 60-79% bilateral stenosis. Stable  6. LE edema - 6 months with leg pain. Lasix TED hose, elevation, ambulation. No clinical signs of DVT's.  Extensive record review today as I am meeting her for the first time.    Medication Adjustments/Labs and Tests Ordered: Current medicines are reviewed at length with the patient today.  Concerns regarding medicines are outlined above.  Medication changes, Labs and Tests ordered today are listed in the Patient Instructions  below. Patient Instructions  Medication Instructions:  Please stop your Amlodipine and Atenolol. Start Losartan 50 mg a day and Carvedilol 6.25 mg twice a day. Increase Furosemide 40 mg to twice a day for 3 days then return to once a day.  Follow-Up: Follow up in 1 month with Dr Anne FuSkains.  If you need a refill on your cardiac medications before your next appointment, please call your pharmacy.  [facility]!!        Signed, Donato SchultzMark Yanni Ruberg, MD  10/09/2016 8:22 AM    [facility] [address], [place], [place]  [address] Phone: [phone]; Fax: [phone]

## 2016-10-08 NOTE — Patient Instructions (Addendum)
Medication Instructions:  Please stop your Amlodipine and Atenolol. Start Losartan 50 mg a day and Carvedilol 6.25 mg twice a day. Increase Furosemide 40 mg to twice a day for 3 days then return to once a day.  Follow-Up: Follow up in 1 month with Dr Anne FuSkains.  If you need a refill on your cardiac medications before your next appointment, please call your pharmacy.  Thank you for choosing [facility]!!

## 2016-10-09 ENCOUNTER — Encounter: Payer: Self-pay | Admitting: Cardiology

## 2016-10-09 DIAGNOSIS — I5022 Chronic systolic (congestive) heart failure: Secondary | ICD-10-CM | POA: Insufficient documentation

## 2016-10-09 DIAGNOSIS — I420 Dilated cardiomyopathy: Secondary | ICD-10-CM | POA: Insufficient documentation

## 2016-10-09 DIAGNOSIS — I2510 Atherosclerotic heart disease of native coronary artery without angina pectoris: Secondary | ICD-10-CM | POA: Insufficient documentation

## 2016-10-09 DIAGNOSIS — Z720 Tobacco use: Secondary | ICD-10-CM | POA: Insufficient documentation

## 2016-11-12 ENCOUNTER — Encounter: Payer: Self-pay | Admitting: *Deleted

## 2016-11-19 ENCOUNTER — Ambulatory Visit: Payer: Medicare Other | Admitting: Cardiology

## 2016-12-17 ENCOUNTER — Ambulatory Visit: Payer: Medicare Other | Admitting: Cardiology

## 2017-06-20 DIAGNOSIS — J9601 Acute respiratory failure with hypoxia: Secondary | ICD-10-CM | POA: Diagnosis not present

## 2017-06-20 DIAGNOSIS — F17203 Nicotine dependence unspecified, with withdrawal: Secondary | ICD-10-CM | POA: Diagnosis not present

## 2017-06-20 DIAGNOSIS — I160 Hypertensive urgency: Secondary | ICD-10-CM | POA: Diagnosis not present

## 2017-06-20 DIAGNOSIS — I2510 Atherosclerotic heart disease of native coronary artery without angina pectoris: Secondary | ICD-10-CM | POA: Diagnosis not present

## 2017-06-20 DIAGNOSIS — I5031 Acute diastolic (congestive) heart failure: Secondary | ICD-10-CM | POA: Diagnosis not present

## 2017-06-20 DIAGNOSIS — F418 Other specified anxiety disorders: Secondary | ICD-10-CM | POA: Diagnosis not present

## 2017-06-20 DIAGNOSIS — J449 Chronic obstructive pulmonary disease, unspecified: Secondary | ICD-10-CM | POA: Diagnosis not present

## 2017-06-21 DIAGNOSIS — J9601 Acute respiratory failure with hypoxia: Secondary | ICD-10-CM | POA: Diagnosis not present

## 2017-06-21 DIAGNOSIS — I160 Hypertensive urgency: Secondary | ICD-10-CM | POA: Diagnosis not present

## 2017-06-21 DIAGNOSIS — D649 Anemia, unspecified: Secondary | ICD-10-CM | POA: Diagnosis not present

## 2017-06-21 DIAGNOSIS — R0602 Shortness of breath: Secondary | ICD-10-CM | POA: Diagnosis not present

## 2017-06-21 DIAGNOSIS — E875 Hyperkalemia: Secondary | ICD-10-CM

## 2017-06-21 DIAGNOSIS — F418 Other specified anxiety disorders: Secondary | ICD-10-CM | POA: Diagnosis not present

## 2017-06-21 DIAGNOSIS — I5031 Acute diastolic (congestive) heart failure: Secondary | ICD-10-CM | POA: Diagnosis not present

## 2017-06-22 DIAGNOSIS — J9601 Acute respiratory failure with hypoxia: Secondary | ICD-10-CM | POA: Diagnosis not present

## 2017-06-22 DIAGNOSIS — D649 Anemia, unspecified: Secondary | ICD-10-CM | POA: Diagnosis not present

## 2017-06-22 DIAGNOSIS — I160 Hypertensive urgency: Secondary | ICD-10-CM | POA: Diagnosis not present

## 2017-06-22 DIAGNOSIS — I5031 Acute diastolic (congestive) heart failure: Secondary | ICD-10-CM | POA: Diagnosis not present

## 2017-06-22 DIAGNOSIS — F418 Other specified anxiety disorders: Secondary | ICD-10-CM | POA: Diagnosis not present

## 2017-06-22 DIAGNOSIS — E875 Hyperkalemia: Secondary | ICD-10-CM | POA: Diagnosis not present

## 2017-06-23 DIAGNOSIS — D649 Anemia, unspecified: Secondary | ICD-10-CM | POA: Diagnosis not present

## 2017-06-23 DIAGNOSIS — I5031 Acute diastolic (congestive) heart failure: Secondary | ICD-10-CM | POA: Diagnosis not present

## 2017-06-23 DIAGNOSIS — J9601 Acute respiratory failure with hypoxia: Secondary | ICD-10-CM | POA: Diagnosis not present

## 2017-06-23 DIAGNOSIS — I160 Hypertensive urgency: Secondary | ICD-10-CM | POA: Diagnosis not present

## 2017-06-23 DIAGNOSIS — F418 Other specified anxiety disorders: Secondary | ICD-10-CM | POA: Diagnosis not present

## 2017-06-23 DIAGNOSIS — E875 Hyperkalemia: Secondary | ICD-10-CM | POA: Diagnosis not present

## 2017-06-30 ENCOUNTER — Encounter: Payer: Self-pay | Admitting: Cardiology

## 2017-06-30 ENCOUNTER — Ambulatory Visit (INDEPENDENT_AMBULATORY_CARE_PROVIDER_SITE_OTHER): Payer: Medicare Other | Admitting: Cardiology

## 2017-06-30 VITALS — BP 110/64 | HR 88 | Ht 60.0 in | Wt 123.1 lb

## 2017-06-30 DIAGNOSIS — I5022 Chronic systolic (congestive) heart failure: Secondary | ICD-10-CM

## 2017-06-30 DIAGNOSIS — Z79899 Other long term (current) drug therapy: Secondary | ICD-10-CM | POA: Diagnosis not present

## 2017-06-30 DIAGNOSIS — I10 Essential (primary) hypertension: Secondary | ICD-10-CM | POA: Diagnosis not present

## 2017-06-30 DIAGNOSIS — Z720 Tobacco use: Secondary | ICD-10-CM | POA: Diagnosis not present

## 2017-06-30 LAB — CBC
Hematocrit: 36.4 % (ref 34.0–46.6)
Hemoglobin: 12.1 g/dL (ref 11.1–15.9)
MCH: 32.3 pg (ref 26.6–33.0)
MCHC: 33.2 g/dL (ref 31.5–35.7)
MCV: 97 fL (ref 79–97)
PLATELETS: 446 10*3/uL — AB (ref 150–379)
RBC: 3.75 x10E6/uL — AB (ref 3.77–5.28)
RDW: 13.7 % (ref 12.3–15.4)
WBC: 6.8 10*3/uL (ref 3.4–10.8)

## 2017-06-30 LAB — BASIC METABOLIC PANEL
BUN/Creatinine Ratio: 15 (ref 12–28)
BUN: 12 mg/dL (ref 8–27)
CHLORIDE: 97 mmol/L (ref 96–106)
CO2: 26 mmol/L (ref 20–29)
Calcium: 9.2 mg/dL (ref 8.7–10.3)
Creatinine, Ser: 0.78 mg/dL (ref 0.57–1.00)
GFR calc Af Amer: 87 mL/min/{1.73_m2} (ref >59)
GFR calc non Af Amer: 75 mL/min/{1.73_m2} (ref >59)
GLUCOSE: 83 mg/dL (ref 65–99)
POTASSIUM: 5 mmol/L (ref 3.5–5.2)
SODIUM: 135 mmol/L (ref 134–144)

## 2017-06-30 NOTE — Progress Notes (Signed)
Cardiology Office Note    Date:  06/30/2017   ID:  [name], DOB 1940-10-26, MRN [id number]  PCP:  Shelbie Ammons, MD  Cardiologist:   Donato Schultz, MD     History of Present Illness:  [name] is a 76 y.o. female with history CAD s/p CABG LIMA to LADin 1997, HTN, HLD, carotid artery disease, COPD, LBBB, GERD here today to establish care with me. She had been followed by Dr. Daleen Squibb in the past. She had single vessel CABG in 1997 (LIMA to LAD). Last cath 2007 with high grade LAD stenosis, patent LIMA to LAD, minimal disease in Circumflex and RCA. Carotid artery dopplers January 2013 with 60-79% stenosis.   Recent echo showed decreased EF 40-45% from normal, mild MR (done 08/11/16).   She is here today for follow up.   [name] - 10/18 -since hospitalization she is felt better but she is still somewhat weakened.  Gave lasix. In hospital. Weak. No CP. Had CP when went to hospital.  Negative for MI she states.     Past Medical History:  Diagnosis Date  . Anxiety state, unspecified   . CAD (coronary artery disease)   . COPD (chronic obstructive pulmonary disease) (HCC)   . Esophageal reflux   . Mixed hyperlipidemia   . Occlusion and stenosis of carotid artery without mention of cerebral infarction   . Unspecified cerebral artery occlusion with cerebral infarction   . Unspecified essential hypertension     Past Surgical History:  Procedure Laterality Date  . ABDOMINAL HYSTERECTOMY    . BACK SURGERY     x 2  . CHOLECYSTECTOMY    . CORONARY ARTERY BYPASS GRAFT  1997   LIMA to LAD  . TOTAL KNEE ARTHROPLASTY     Bilateral    Current Medications: Outpatient Medications Prior to Visit  Medication Sig Dispense Refill  . ALPRAZolam (NIRAVAM) 0.5 MG dissolvable tablet Take 1 mg by mouth 4 (four) times daily as needed.      Marland Kitchen aspirin 81 MG tablet Take 81 mg by mouth daily.      Marland Kitchen buPROPion (WELLBUTRIN SR) 150 MG 12 hr tablet Take 150 mg by mouth 2 (two) times daily.        . carvedilol (COREG) 6.25 MG tablet Take 1 tablet (6.25 mg total) by mouth 2 (two) times daily. 60 tablet 6  . cholecalciferol (VITAMIN D) 1000 UNITS tablet Take 1,000 Units by mouth daily.      Marland Kitchen donepezil (ARICEPT) 5 MG tablet Take 5 mg by mouth at bedtime as needed.      . fish oil-omega-3 fatty acids 1000 MG capsule Take 1 g by mouth daily.      . furosemide (LASIX) 40 MG tablet Take 40 mg by mouth daily.      . meclizine (ANTIVERT) 25 MG tablet Take 25 mg by mouth 3 (three) times daily as needed.      Marland Kitchen oxyCODONE-acetaminophen (PERCOCET) 10-325 MG per tablet Take 1 tablet by mouth as needed.      Marland Kitchen perphenazine (TRILAFON) 4 MG tablet Take 4 mg by mouth 2 (two) times daily.      . ranitidine (ZANTAC) 150 MG capsule Take 150 mg by mouth every evening.      . simvastatin (ZOCOR) 40 MG tablet Take 40 mg by mouth at bedtime.      . topiramate (TOPAMAX) 100 MG tablet Take 100 mg by mouth daily.      . hydrochlorothiazide (HYDRODIURIL) 25  MG tablet Take 25 mg by mouth daily.      Marland Kitchen losartan (COZAAR) 50 MG tablet Take 1 tablet (50 mg total) by mouth daily. 30 tablet 6   No facility-administered medications prior to visit.      Allergies:   Patient has no known allergies.   Social History   Social History  . Marital status: Divorced    Spouse name: N/A  . Number of children: 1  . Years of education: N/A   Occupational History  . Retired-disability    Social History Main Topics  . Smoking status: Former Smoker    Packs/day: 1.00    Years: 25.00    Types: Cigarettes    Quit date: 08/14/1993  . Smokeless tobacco: Never Used  . Alcohol use No  . Drug use: No  . Sexual activity: Not Asked   Other Topics Concern  . None   Social History Narrative  . None     Family History:  The patient's family history includes Cancer - Other in her mother; Heart attack (age of onset: 29) in her father.   ROS:   Please see the history of present illness.   Denies syncope bleeding orthopnea  PND ROS All other systems reviewed and are negative.   PHYSICAL EXAM:   VS:  BP 110/64   Pulse 88   Ht 5' (1.524 m)   Wt 123 lb 1.9 oz (55.8 kg)   LMP  (LMP Unknown)   BMI 24.05 kg/m    GEN: Well nourished, well developed, in no acute distress  HEENT: normal  Neck: no JVD, carotid bruits, or masses Cardiac: RRR; 1/6 SM, no rubs, or gallops,no edema  Respiratory:  clear to auscultation bilaterally, normal work of breathing GI: soft, nontender, nondistended, + BS MS: no deformity or atrophy  Skin: warm and dry, no rash Neuro:  Alert and Oriented x 3, Strength and sensation are intact Psych: euthymic mood, full affect   Wt Readings from Last 3 Encounters:  06/30/17 123 lb 1.9 oz (55.8 kg)  10/08/16 147 lb 12.8 oz (67 kg)  08/14/13 176 lb (79.8 kg)      Studies/Labs Reviewed:   EKG:  EKG is ordered today.  The ekg ordered today demonstrates 10/08/16 - NSR, LBBB, NSSTW changes, personally viewed. No significant change from prior (1st degree AVB no longer seen)  Recent Labs: No results found for requested labs within last 8760 hours.   Lipid Panel    Component Value Date/Time   CHOL  07/17/2010 0149    129        ATP III CLASSIFICATION:  <200     mg/dL   Desirable  161-096  mg/dL   Borderline High  >=045    mg/dL   High          TRIG 83 07/17/2010 0149   HDL 50 07/17/2010 0149   CHOLHDL 2.6 07/17/2010 0149   VLDL 17 07/17/2010 0149   LDLCALC  07/17/2010 0149    62        Total Cholesterol/HDL:CHD Risk Coronary Heart Disease Risk Table                     Men   Women  1/2 Average Risk   3.4   3.3  Average Risk       5.0   4.4  2 X Average Risk   9.6   7.1  3 X Average Risk  23.4   11.0  Use the calculated Patient Ratio above and the CHD Risk Table to determine the patient's CHD Risk.        ATP III CLASSIFICATION (LDL):  <100     mg/dL   Optimal  161-096100-129  mg/dL   Near or Above                    Optimal  130-159  mg/dL   Borderline  045-409160-189  mg/dL    High  >811>190     mg/dL   Very High    Additional studies/ records that were reviewed today include:  Prior office notes, labs, ecg, echo reviewed.     ASSESSMENT:    1. Essential hypertension   2. Long-term use of high-risk medication   3. Chronic systolic CHF (congestive heart failure) (HCC)   4. Tobacco abuse      PLAN:  In order of problems listed above:  1. CAD/ Ischemic cardiomyopathy/chronic systolic heart failure:   -[facility]  Lasix utilized.  - Salt and fluid restriction  - Lasix 40 QD currently.  - Stopped amlodipine at prior visit (helps with edema as well) and changed atenolol to coreg 6.25 BID for HF use.   - Restarted losartan 50 QD   - Check BMET and CBC.  2. HTN: BP is excellent.   3. Hyperlipidemia: Continue statin. Lipids followed in primary care.  Simvastatin  4. LBBB: Chronic. No indications for pacemaker, no syncope  5. Carotid artery disease:  dopplers 2014 with 60-79% bilateral stenosis. Stable  6. LE edema - improved.   7. Tobacco - quit 8 days.   We will have her come back in 1 month to try to avoid hospitalization once again.  Noted significant weight loss.  Medication Adjustments/Labs and Tests Ordered: Current medicines are reviewed at length with the patient today.  Concerns regarding medicines are outlined above.  Medication changes, Labs and Tests ordered today are listed in the Patient Instructions below. Patient Instructions  Medication Instructions:  Please stop Hydrochlorothiazide. Take Furosemide 40 mg once a day. Continue all other medications as listed.   Labwork: Please have blood work today. (CBC, BMP)  Follow-Up: Follow up in 1 month with NP/PA.  If you need a refill on your cardiac medications before your next appointment, please call your pharmacy.  [facility]!!        Signed, Donato SchultzMark Jacorie Ernsberger, MD  06/30/2017 10:58 AM      [facility] [address], [place], [place]  [address] Phone: [phone](336) [phone]; Fax: [phone]

## 2017-06-30 NOTE — Patient Instructions (Addendum)
Medication Instructions:  Please stop Hydrochlorothiazide. Take Furosemide 40 mg once a day. Continue all other medications as listed.   Labwork: Please have blood work today. (CBC, BMP)  Follow-Up: Follow up in 1 month with NP/PA.  If you need a refill on your cardiac medications before your next appointment, please call your pharmacy.  Thank you for choosing [facility]!!

## 2017-08-03 ENCOUNTER — Ambulatory Visit: Payer: Medicare Other | Admitting: Nurse Practitioner

## 2017-10-03 ENCOUNTER — Ambulatory Visit: Payer: Medicare Other | Admitting: Nurse Practitioner

## 2017-10-03 NOTE — Progress Notes (Deleted)
CARDIOLOGY OFFICE NOTE  Date:  10/03/2017    Cynda Familia Date of Birth: April 24, 1941 Medical Record #[id number]  PCP:  Shelbie Ammons, MD  Cardiologist:  Anne Fu    No chief complaint on file.   History of Present Illness: [name] is a 77 y.o. female who presents today for a follow up visit. Seen for Dr. Anne Fu. She had been followed by Dr. Daleen Squibb in the past.   She has a history of CAD s/p CABG with LIMA to LAD in 1997, HTN, HLD, carotid artery disease, COPD, LBBB, & GERD.  She had single vessel CABG in 1997 (LIMA to LAD). Last cath 2007 with high grade LAD stenosis, patent LIMA to LAD, minimal disease in Circumflex and RCA. Carotid artery dopplers January 2013 with 60-79% stenosis.   Echo showed decreased EF 40-45% from normal, mild MR (done 08/11/16).   Seen here back in October by Dr. Anne Fu - had been hospitalized at [facility] for chest pain. Was to have come back in a month.   Comes in today. Here with   Past Medical History:  Diagnosis Date  . Anxiety state, unspecified   . CAD (coronary artery disease)   . COPD (chronic obstructive pulmonary disease) (HCC)   . Esophageal reflux   . Mixed hyperlipidemia   . Occlusion and stenosis of carotid artery without mention of cerebral infarction   . Unspecified cerebral artery occlusion with cerebral infarction   . Unspecified essential hypertension     Past Surgical History:  Procedure Laterality Date  . ABDOMINAL HYSTERECTOMY    . BACK SURGERY     x 2  . CHOLECYSTECTOMY    . CORONARY ARTERY BYPASS GRAFT  1997   LIMA to LAD  . TOTAL KNEE ARTHROPLASTY     Bilateral     Medications: No outpatient medications have been marked as taking for the 10/03/17 encounter (Appointment) with Rosalio Macadamia, NP.     Allergies: No Known Allergies  Social History: The patient  reports that she quit smoking about 24 years ago. Her smoking use included cigarettes. She has a 25.00 pack-year smoking history. she has never  used smokeless tobacco. She reports that she does not drink alcohol or use drugs.   Family History: The patient's family history includes Cancer - Other in her mother; Heart attack (age of onset: 53) in her father.   Review of Systems: Please see the history of present illness.   Otherwise, the review of systems is positive for none.   All other systems are reviewed and negative.   Physical Exam: VS:  LMP  (LMP Unknown)  .  BMI There is no height or weight on file to calculate BMI.  Wt Readings from Last 3 Encounters:  06/30/17 123 lb 1.9 oz (55.8 kg)  10/08/16 147 lb 12.8 oz (67 kg)  08/14/13 176 lb (79.8 kg)    General: Pleasant. Well developed, well nourished and in no acute distress.   HEENT: Normal.  Neck: Supple, no JVD, carotid bruits, or masses noted.  Cardiac: Regular rate and rhythm. No murmurs, rubs, or gallops. No edema.  Respiratory:  Lungs are clear to auscultation bilaterally with normal work of breathing.  GI: Soft and nontender.  MS: No deformity or atrophy. Gait and ROM intact.  Skin: Warm and dry. Color is normal.  Neuro:  Strength and sensation are intact and no gross focal deficits noted.  Psych: Alert, appropriate and with normal affect.   LABORATORY DATA:  EKG:  EKG is not ordered today.  Lab Results  Component Value Date   WBC 6.8 06/30/2017   HGB 12.1 06/30/2017   HCT 36.4 06/30/2017   PLT 446 (H) 06/30/2017   GLUCOSE 83 06/30/2017   CHOL  07/17/2010    129        ATP III CLASSIFICATION:  <200     mg/dL   Desirable  696-295  mg/dL   Borderline High  >=284    mg/dL   High          TRIG 83 07/17/2010   HDL 50 07/17/2010   LDLCALC  07/17/2010    62        Total Cholesterol/HDL:CHD Risk Coronary Heart Disease Risk Table                     Men   Women  1/2 Average Risk   3.4   3.3  Average Risk       5.0   4.4  2 X Average Risk   9.6   7.1  3 X Average Risk  23.4   11.0        Use the calculated Patient Ratio above and the CHD Risk  Table to determine the patient's CHD Risk.        ATP III CLASSIFICATION (LDL):  <100     mg/dL   Optimal  132-440  mg/dL   Near or Above                    Optimal  130-159  mg/dL   Borderline  102-725  mg/dL   High  >366     mg/dL   Very High   ALT 18 44/11/4740   AST 18 07/17/2010   NA 135 06/30/2017   K 5.0 06/30/2017   CL 97 06/30/2017   CREATININE 0.78 06/30/2017   BUN 12 06/30/2017   CO2 26 06/30/2017   TSH 1.031 07/17/2010   INR 0.90 07/17/2010   HGBA1C  07/17/2010    5.4 (NOTE)                                                                       According to the ADA Clinical Practice Recommendations for 2011, when HbA1c is used as a screening test:   >=6.5%   Diagnostic of Diabetes Mellitus           (if abnormal result  is confirmed)  5.7-6.4%   Increased risk of developing Diabetes Mellitus  References:Diagnosis and Classification of Diabetes Mellitus,Diabetes Care,2011,34(Suppl 1):S62-S69 and Standards of Medical Care in         Diabetes - 2011,Diabetes Care,2011,34  (Suppl 1):S11-S61.     BNP (last 3 results) No results for input(s): BNP in the last 8760 hours.  ProBNP (last 3 results) No results for input(s): PROBNP in the last 8760 hours.   Other Studies Reviewed Today:   Assessment/Plan:  1. CAD/ Ischemic cardiomyopathy/chronic systolic heart failure:   -Recent hospitalization in October 2018 at [facility].  Lasix utilized.  - Salt and fluid restriction  - Lasix 40 QD currently.  - Stopped amlodipine at prior visit (helps with edema as well) and changed atenolol to coreg 6.25  BID for HF use.   - Restarted losartan 50 QD   - Check BMET and CBC.  2. HTN: BP is excellent.   3. Hyperlipidemia: Continue statin. Lipids followed in primary care.  Simvastatin  4. LBBB: Chronic. No indications for pacemaker, no syncope  5.Carotid artery disease: dopplers 2014 with 60-79% bilateral stenosis. Stable  6. LE edema - improved.   7. Tobacco -  quit 8 days.   Current medicines are reviewed with the patient today.  The patient does not have concerns regarding medicines other than what has been noted above.  The following changes have been made:  See above.  Labs/ tests ordered today include:   No orders of the defined types were placed in this encounter.    Disposition:   FU with Dr. Anne FuSkains in {gen number [id number]} {Days to years:10300}.   Patient is agreeable to this plan and will call if any problems develop in the interim.   SignedNorma Fredrickson: Syrah Daughtrey, NP  10/03/2017 7:35 AM  [facility] [address] [place], [place]  [address] Phone: [phone] Fax: [phone]

## 2017-10-04 ENCOUNTER — Encounter: Payer: Self-pay | Admitting: Nurse Practitioner

## 2017-10-17 ENCOUNTER — Encounter: Payer: Self-pay | Admitting: Nurse Practitioner

## 2017-10-27 ENCOUNTER — Encounter: Payer: Self-pay | Admitting: Nurse Practitioner

## 2017-11-01 ENCOUNTER — Encounter: Payer: Self-pay | Admitting: Nurse Practitioner

## 2018-01-17 DIAGNOSIS — F418 Other specified anxiety disorders: Secondary | ICD-10-CM | POA: Diagnosis not present

## 2018-01-17 DIAGNOSIS — J449 Chronic obstructive pulmonary disease, unspecified: Secondary | ICD-10-CM | POA: Diagnosis not present

## 2018-01-17 DIAGNOSIS — R262 Difficulty in walking, not elsewhere classified: Secondary | ICD-10-CM | POA: Diagnosis not present

## 2018-01-17 DIAGNOSIS — R4182 Altered mental status, unspecified: Secondary | ICD-10-CM | POA: Diagnosis not present

## 2018-01-17 DIAGNOSIS — I160 Hypertensive urgency: Secondary | ICD-10-CM | POA: Diagnosis not present

## 2018-01-17 DIAGNOSIS — Z79899 Other long term (current) drug therapy: Secondary | ICD-10-CM | POA: Diagnosis not present

## 2018-01-17 DIAGNOSIS — E871 Hypo-osmolality and hyponatremia: Secondary | ICD-10-CM | POA: Diagnosis not present

## 2018-01-17 DIAGNOSIS — I2510 Atherosclerotic heart disease of native coronary artery without angina pectoris: Secondary | ICD-10-CM | POA: Diagnosis not present

## 2018-01-17 DIAGNOSIS — E785 Hyperlipidemia, unspecified: Secondary | ICD-10-CM | POA: Diagnosis not present

## 2018-01-17 DIAGNOSIS — E876 Hypokalemia: Secondary | ICD-10-CM | POA: Diagnosis not present

## 2018-04-26 DIAGNOSIS — I2510 Atherosclerotic heart disease of native coronary artery without angina pectoris: Secondary | ICD-10-CM

## 2018-04-26 DIAGNOSIS — N390 Urinary tract infection, site not specified: Secondary | ICD-10-CM

## 2018-04-26 DIAGNOSIS — E871 Hypo-osmolality and hyponatremia: Secondary | ICD-10-CM

## 2018-04-26 DIAGNOSIS — E222 Syndrome of inappropriate secretion of antidiuretic hormone: Secondary | ICD-10-CM

## 2018-04-26 DIAGNOSIS — B9620 Unspecified Escherichia coli [E. coli] as the cause of diseases classified elsewhere: Secondary | ICD-10-CM

## 2018-04-26 DIAGNOSIS — I10 Essential (primary) hypertension: Secondary | ICD-10-CM

## 2018-07-14 DIAGNOSIS — I340 Nonrheumatic mitral (valve) insufficiency: Secondary | ICD-10-CM

## 2018-07-14 DIAGNOSIS — E871 Hypo-osmolality and hyponatremia: Secondary | ICD-10-CM

## 2018-07-14 DIAGNOSIS — I10 Essential (primary) hypertension: Secondary | ICD-10-CM

## 2018-07-14 DIAGNOSIS — J449 Chronic obstructive pulmonary disease, unspecified: Secondary | ICD-10-CM

## 2018-07-14 DIAGNOSIS — I2510 Atherosclerotic heart disease of native coronary artery without angina pectoris: Secondary | ICD-10-CM

## 2018-07-14 DIAGNOSIS — I509 Heart failure, unspecified: Secondary | ICD-10-CM | POA: Diagnosis not present

## 2018-07-14 DIAGNOSIS — R079 Chest pain, unspecified: Secondary | ICD-10-CM

## 2018-07-14 DIAGNOSIS — Z79899 Other long term (current) drug therapy: Secondary | ICD-10-CM

## 2018-07-15 DIAGNOSIS — I2510 Atherosclerotic heart disease of native coronary artery without angina pectoris: Secondary | ICD-10-CM | POA: Diagnosis not present

## 2018-07-15 DIAGNOSIS — R079 Chest pain, unspecified: Secondary | ICD-10-CM | POA: Diagnosis not present

## 2018-07-15 DIAGNOSIS — J449 Chronic obstructive pulmonary disease, unspecified: Secondary | ICD-10-CM | POA: Diagnosis not present

## 2018-07-15 DIAGNOSIS — I509 Heart failure, unspecified: Secondary | ICD-10-CM | POA: Diagnosis not present

## 2023-02-05 DEATH — deceased
